# Patient Record
Sex: Male | Born: 1945 | Marital: Married | Smoking: Never smoker
Health system: Southern US, Community
[De-identification: ages and names within clinical notes are randomized; demographics above are authoritative.]

## PROBLEM LIST (undated history)

## (undated) DIAGNOSIS — E785 Hyperlipidemia, unspecified: Secondary | ICD-10-CM

## (undated) DIAGNOSIS — R251 Tremor, unspecified: Secondary | ICD-10-CM

## (undated) HISTORY — DX: Hyperlipidemia, unspecified: E78.5

## (undated) HISTORY — PX: BACK SURGERY: SHX140

## (undated) HISTORY — PX: ADENOIDECTOMY: SUR15

## (undated) HISTORY — DX: Tremor, unspecified: R25.1

## (undated) HISTORY — PX: SHOULDER SURGERY: SHX246

## (undated) HISTORY — PX: TONSILLECTOMY: SHX5217

---

## 2016-09-28 ENCOUNTER — Encounter: Payer: Self-pay | Admitting: Family Medicine

## 2016-09-28 ENCOUNTER — Ambulatory Visit (INDEPENDENT_AMBULATORY_CARE_PROVIDER_SITE_OTHER): Payer: Medicare Other | Admitting: Family Medicine

## 2016-09-28 VITALS — BP 140/80 | HR 78 | Ht 69.0 in | Wt 225.0 lb

## 2016-09-28 DIAGNOSIS — E669 Obesity, unspecified: Secondary | ICD-10-CM | POA: Diagnosis not present

## 2016-09-28 DIAGNOSIS — E785 Hyperlipidemia, unspecified: Secondary | ICD-10-CM

## 2016-09-28 DIAGNOSIS — R251 Tremor, unspecified: Secondary | ICD-10-CM

## 2016-09-28 HISTORY — DX: Tremor, unspecified: R25.1

## 2016-09-28 NOTE — Progress Notes (Signed)
Shaun Prince is a 71 y.o. male here to establish care.  Clovis CaoAutumn McNeil, CMA is acting as a Neurosurgeonscribe for KeySpanErica Adaliz Dobis, DO.  History of Present Illness:   Chief Complaint  Patient presents with  . Establish Care   Acute Concerns: Recurrent left shoulder pain. Surgery approximately 6 years ago.   Chronic Issues: Hyperlipidemia: Takes Atorvastatin 5 mg 1qd.  Tremors: Takes Propanolol 80mg  1qd.   Past Medical History:  Diagnosis Date  . Hyperlipidemia   . Tremor 09/28/2016   Social History   Social History  . Marital status: Married    Spouse name: N/A  . Number of children: N/A  . Years of education: N/A   Occupational History  . Not on file.   Social History Main Topics  . Smoking status: Never Smoker  . Smokeless tobacco: Current User    Types: Snuff  . Alcohol use Yes  . Drug use: No  . Sexual activity: Not on file   Other Topics Concern  . Not on file   Social History Narrative  . No narrative on file   Past Surgical History:  Procedure Laterality Date  . BACK SURGERY N/A   . SHOULDER SURGERY Left    Family History  Problem Relation Age of Onset  . Diabetes Mother   . Heart attack Father   . Diabetes Maternal Grandmother   . Heart attack Paternal Grandfather    No Known Allergies  Current Medications:   Current Outpatient Prescriptions:  Marland Kitchen.  Multiple Vitamins-Minerals (CENTRUM ADULTS) TABS, Take by mouth., Disp: , Rfl:  .  propranolol (INDERAL) 80 MG tablet, Take 80 mg by mouth once., Disp: , Rfl:  .  rosuvastatin (CRESTOR) 5 MG tablet, Take 5 mg by mouth daily., Disp: , Rfl:    Review of Systems:   Review of Systems  Constitutional: Negative for chills, diaphoresis, fever, malaise/fatigue and weight loss.  HENT: Negative for congestion, ear discharge, ear pain, hearing loss, nosebleeds, sinus pain, sore throat and tinnitus.   Eyes: Negative for blurred vision, double vision, photophobia, pain, discharge and redness.  Respiratory: Negative for  cough, hemoptysis, sputum production, shortness of breath, wheezing and stridor.   Cardiovascular: Negative for chest pain, palpitations, orthopnea, claudication, leg swelling and PND.  Gastrointestinal: Positive for heartburn. Negative for abdominal pain, blood in stool, constipation, diarrhea, melena, nausea and vomiting.  Genitourinary: Negative for dysuria, flank pain, frequency, hematuria and urgency.  Musculoskeletal: Positive for joint pain and myalgias.  Skin: Negative for itching and rash.  Neurological: Positive for tremors. Negative for dizziness, tingling, sensory change, speech change, focal weakness, seizures, loss of consciousness, weakness and headaches.  Endo/Heme/Allergies: Negative for environmental allergies and polydipsia. Does not bruise/bleed easily.  Psychiatric/Behavioral: Negative for depression, hallucinations, memory loss, substance abuse and suicidal ideas. The patient is not nervous/anxious and does not have insomnia.    Vitals:   Vitals:   09/28/16 1105  BP: 140/80  Pulse: 78  SpO2: 98%  Weight: 225 lb (102.1 kg)  Height: 5\' 9"  (1.753 m)     Body mass index is 33.23 kg/m.  Physical Exam:   Physical Exam  Constitutional: He is oriented to person, place, and time. He appears well-developed and well-nourished. No distress.  HENT:  Head: Normocephalic and atraumatic.  Right Ear: External ear normal.  Left Ear: External ear normal.  Nose: Nose normal.  Mouth/Throat: Oropharynx is clear and moist.  Eyes: Pupils are equal, round, and reactive to light. Conjunctivae and EOM are normal.  Neck:  Normal range of motion. Neck supple.  Cardiovascular: Normal rate, regular rhythm, normal heart sounds and intact distal pulses.   Pulmonary/Chest: Effort normal and breath sounds normal.  Abdominal: Soft. Bowel sounds are normal.  Musculoskeletal: Normal range of motion.  Neurological: He is alert and oriented to person, place, and time.  Skin: Skin is warm and  dry.  Psychiatric: He has a normal mood and affect. His behavior is normal. Judgment and thought content normal.  Nursing note and vitals reviewed.  Assessment and Plan:   Marcial Pacasimothy was seen today for establish care.  Diagnoses and all orders for this visit:  Tremor Comments: Well controlled.  No signs of complications, medication side effects, or red flags.  Continue current regimen.    Hyperlipidemia, unspecified hyperlipidemia type Comments: Well controlled.  No signs of complications, medication side effects, or red flags.  Continue current regimen.    Obesity (BMI 30-39.9) Comments: The patient is asked to make an attempt to improve diet and exercise patterns to aid in medical management of this problem.    . Reviewed expectations re: course of current medical issues. . Discussed self-management of symptoms. . Outlined signs and symptoms indicating need for more acute intervention. . Patient verbalized understanding and all questions were answered. Marland Kitchen. Health Maintenance issues including appropriate healthy diet, exercise, and smoking avoidance were discussed with patient. . See orders for this visit as documented in the electronic medical record. . Patient received an After Visit Summary.  CMA served as Neurosurgeonscribe during this visit. History, Physical, and Plan performed by medical provider. The above documentation has been reviewed and is accurate and complete. Helane RimaErica Naveen Clardy, D.O.  Helane RimaErica Yacob Wilkerson, DO [place] 09/28/2016

## 2016-11-18 ENCOUNTER — Telehealth: Payer: Self-pay | Admitting: Family Medicine

## 2016-11-18 ENCOUNTER — Encounter: Payer: Self-pay | Admitting: Family Medicine

## 2016-11-18 NOTE — Telephone Encounter (Signed)
Faxed ROI to Doctor Ria Clock [phone] mc

## 2016-11-23 NOTE — Progress Notes (Deleted)
Subjective:   Shaun Prince is a 71 y.o. male who presents for an Initial Medicare Annual Wellness Visit.  Review of Systems  No ROS.  Medicare Wellness Visit. Additional risk factors are reflected in the social history.       Objective:    There were no vitals filed for this visit. There is no height or weight on file to calculate BMI.  Current Medications (verified) Outpatient Encounter Prescriptions as of 11/26/2016  Medication Sig  . Multiple Vitamins-Minerals (CENTRUM ADULTS) TABS Take by mouth.  . propranolol (INDERAL) 80 MG tablet Take 80 mg by mouth once.  . rosuvastatin (CRESTOR) 5 MG tablet Take 5 mg by mouth daily.   No facility-administered encounter medications on file as of 11/26/2016.     Allergies (verified) Patient has no known allergies.   History: Past Medical History:  Diagnosis Date  . Hyperlipidemia   . Tremor 09/28/2016   Past Surgical History:  Procedure Laterality Date  . BACK SURGERY N/A   . SHOULDER SURGERY Left    Family History  Problem Relation Age of Onset  . Diabetes Mother   . Heart attack Father   . Diabetes Maternal Grandmother   . Heart attack Paternal Grandfather    Social History   Occupational History  . Not on file.   Social History Main Topics  . Smoking status: Never Smoker  . Smokeless tobacco: Current User    Types: Snuff  . Alcohol use Yes  . Drug use: No  . Sexual activity: Not on file   Tobacco Counseling Ready to quit: Not Answered Counseling given: Not Answered   Activities of Daily Living No flowsheet data found.  Immunizations and Health Maintenance  There is no immunization history on file for this patient. Health Maintenance Due  Topic Date Due  . Hepatitis C Screening  1945/09/24  . TETANUS/TDAP  06/08/1965  . COLONOSCOPY  06/08/1996  . PNA vac Low Risk Adult (1 of 2 - PCV13) 06/09/2011  . INFLUENZA VACCINE  09/30/2016    Patient Care Team: Helane Rima, DO as PCP - General  (Family Medicine)  Indicate any recent Medical Services you may have received from other than [facility] providers in the past year (date may be approximate).    Assessment:   This is a routine wellness examination for Shaun Prince. Physical assessment deferred to PCP.  Hearing/Vision screen No exam data present  Dietary issues and exercise activities discussed:    Goals    None     Depression Screen PHQ 2/9 Scores 09/28/2016  PHQ - 2 Score 0    Fall Risk Fall Risk  09/28/2016  Falls in the past year? No    Cognitive Function:        Screening Tests Health Maintenance  Topic Date Due  . Hepatitis C Screening  1946/02/11  . TETANUS/TDAP  06/08/1965  . COLONOSCOPY  06/08/1996  . PNA vac Low Risk Adult (1 of 2 - PCV13) 06/09/2011  . INFLUENZA VACCINE  09/30/2016        Plan:   Follow up with PCP as directed.  I have personally reviewed and noted the following in the patient's chart:   . Medical and social history . Use of alcohol, tobacco or illicit drugs  . Current medications and supplements . Functional ability and status . Nutritional status . Physical activity . Advanced directives . List of other physicians . Vitals . Screenings to include cognitive, depression, and falls . Referrals and appointments  In  addition, I have reviewed and discussed with patient certain preventive protocols, quality metrics, and best practice recommendations. A written personalized care plan for preventive services as well as general preventive health recommendations were provided to patient.     Richelle Ito, RN   11/23/2016

## 2016-11-23 NOTE — Progress Notes (Deleted)
PCP notes:   Health maintenance: Hepatitis C screening: Tdap: Colonoscopy: PCV 13: Flu:  Abnormal screenings:    Patient concerns:    Nurse concerns:   Next PCP appt:

## 2016-11-23 NOTE — Progress Notes (Deleted)
Pre visit review using our clinic review tool, if applicable. No additional management support is needed unless otherwise documented below in the visit note. 

## 2016-11-26 ENCOUNTER — Ambulatory Visit: Payer: Medicare Other | Admitting: *Deleted

## 2017-03-11 ENCOUNTER — Ambulatory Visit (INDEPENDENT_AMBULATORY_CARE_PROVIDER_SITE_OTHER): Payer: Medicare Other | Admitting: Family Medicine

## 2017-03-11 ENCOUNTER — Encounter: Payer: Self-pay | Admitting: Family Medicine

## 2017-03-11 VITALS — BP 134/78 | HR 62 | Temp 97.8°F | Ht 69.0 in | Wt 237.0 lb

## 2017-03-11 DIAGNOSIS — L578 Other skin changes due to chronic exposure to nonionizing radiation: Secondary | ICD-10-CM

## 2017-03-11 DIAGNOSIS — L989 Disorder of the skin and subcutaneous tissue, unspecified: Secondary | ICD-10-CM

## 2017-03-11 DIAGNOSIS — L570 Actinic keratosis: Secondary | ICD-10-CM

## 2017-03-11 DIAGNOSIS — L859 Epidermal thickening, unspecified: Secondary | ICD-10-CM | POA: Diagnosis not present

## 2017-03-11 NOTE — Patient Instructions (Signed)
We will have the biopsy back in a few days.  Please avoid UV radiation and sun exposure.   Skin Biopsy, Care After Refer to this sheet in the next few weeks. These instructions provide you with information about caring for yourself after your procedure. Your health care provider may also give you more specific instructions. Your treatment has been planned according to current medical practices, but problems sometimes occur. Call your health care provider if you have any problems or questions after your procedure. What can I expect after the procedure? After the procedure, it is common to have:  Soreness.  Bruising.  Itching.  Follow these instructions at home:  Rest and then return to your normal activities as told by your health care provider.  Take over-the-counter and prescription medicines only as told by your health care provider.  Follow instructions from your health care provider about how to take care of your biopsy site.Make sure you: ? Wash your hands with soap and water before you change your bandage (dressing). If soap and water are not available, use hand sanitizer. ? Change your dressing as told by your health care provider. ? Leave stitches (sutures), skin glue, or adhesive strips in place. These skin closures may need to stay in place for 2 weeks or longer. If adhesive strip edges start to loosen and curl up, you may trim the loose edges. Do not remove adhesive strips completely unless your health care provider tells you to do that. If the biopsy area bleeds, apply gentle pressure for 10 minutes.  Check your biopsy site every day for signs of infection. Check for: ? More redness, swelling, or pain. ? More fluid or blood. ? Warmth. ? Pus or a bad smell.  Keep all follow-up visits as told by your health care provider. This is important. Contact a health care provider if:  You have more redness, swelling, or pain around your biopsy site.  You have more fluid or  blood coming from your biopsy site.  Your biopsy site feels warm to the touch.  You have pus or a bad smell coming from your biopsy site.  You have a fever. Get help right away if:  You have bleeding that does not stop with pressure or a dressing. This information is not intended to replace advice given to you by your health care provider. Make sure you discuss any questions you have with your health care provider. Document Released: 03/15/2015 Document Revised: 10/13/2015 Document Reviewed: 05/16/2014 Elsevier Interactive Patient Education  [employer]2018 Elsevier Inc.

## 2017-03-11 NOTE — Progress Notes (Signed)
   Subjective:  Shaun Prince is a 72 y.o. male who presents today with a chief complaint of skin lesions.   HPI:  Skin Lesion, New Problem He has noticed 2 lesions on the anterior superior aspect of the scalp that started a year or 2 ago.  The most posterior of these lesions has become more irritated over the last few weeks.  No fevers or chills.  He has had to have cryotherapy in the past.  No drainage from the area.  No obvious precipitating events.  He has also noticed one lesion behind his right ear started about a year ago.  He is currently size since then.  No pruritus.  No drainage.  No bleeding.  No home treatments tried.  ROS: Per HPI  Objective:  Physical Exam: BP 134/78 (BP Location: Left Arm, Patient Position: Sitting, Cuff Size: Normal)   Pulse 62   Temp 97.8 F (36.6 C) (Oral)   Ht 5\' 9"  (1.753 m)   Wt 237 lb (107.5 kg)   SpO2 96%   BMI 35.00 kg/m   Skin: -Scalp: Several AK's noted on posterior scalp 1 with surrounding irritation. -Approximately 2-3 mm pink, papular lesion on right mastoid process.  Cryotherapy Procedure Note  Pre-operative Diagnosis: Actinic keratosis  Post-operative Diagnosis: Actinic keratosis  Locations: Posterior scalp  Procedure Details   Patient informed of risks (permanent scarring, infection, light or dark discoloration, bleeding, infection, weakness, numbness and recurrence of the lesion) and benefits of the procedure and verbal informed consent obtained.  The two AKs noted above were are treated with liquid nitrogen therapy, frozen until ice ball extended 2 mm beyond lesion, allowed to thaw, and treated again. The patient tolerated procedure well.  The patient was instructed on post-op care, warned that there may be blister formation, redness and pain. Recommend OTC analgesia as needed for pain.  Condition: Stable  Complications: none.  Punch Biopsy Procedure Note  Pre-operative Diagnosis: Suspicious  lesion  Post-operative Diagnosis: normal, same  Locations: Right Scalp  Indications: Diagnostic  Anesthesia: Lidocaine 1% with epinephrine without added sodium bicarbonate  Procedure Details  Patient informed of the risks (including bleeding and infection) and benefits of the  procedure and Written informed consent obtained.  The lesion and surrounding area was given a sterile prep using betadyne and draped in the usual sterile fashion. The skin was then stretched perpendicular to the skin tension lines and the lesion removed using the 4mm punch. Antibiotic ointment and a sterile dressing applied. The specimen was sent for pathologic examination. The patient tolerated the procedure well.  EBL: 2 ml  Condition: Stable  Complications: none.  Assessment/Plan:  Actinic keratosis/sun damaged skin Lesions on anterior/posterior scalp consistent with a case.  Cryotherapy applied today.  Please see above procedure note.  Advised patient to avoid UV radiation as much as possible.  Discussed warning signs and reasons to return to care.  Skin lesion Differential includes atypical nevus, atypical SK, and malignancy. Punch biopsy performed today on skin lesion on right mastoid process.  Please see above procedure note.  Pathology sent.  Advised ibuprofen as needed for pain control.  As above, advised patient to avoid UV radiation as much as possible.  Discussed warning signs and reasons to return to care.  Katina Degreealeb M. Jimmey RalphParker, MD 03/11/2017 11:21 AM

## 2017-03-12 NOTE — Progress Notes (Signed)
Pathology report was benign.  Please inform patient.  Do not need to make any other changes or do any other biopsies at this time.

## 2017-03-15 NOTE — Progress Notes (Signed)
I have printed the appointment and have [facility] to ensure the correct protocol was followed.

## 2017-04-16 NOTE — Progress Notes (Signed)
[facility]   Patient Details  Name: Shaun Prince MRN: [id number] Date of Birth: 02/06/1946 Age: 72 y.o. PCP: Helane RimaWallace, Erica, DO  Vitals:   04/16/17 1348  BP: (!) 150/94  Pulse: 68  Resp: 18  SpO2: 95%  Weight: 234 lb (106.1 kg)     [facility] Eval - 04/16/17 1300      Referral    Referring Provider  Dr. Earlene PlaterWallace    Reason for referral  Obesitity/Overweight      Measurement   Neck measurement  16.5 Inches    Waist Circumference  44 inches    Body fat  35.6 percent      Mobility and Daily Activities   I find it easy to walk up or down two or more flights of stairs.  3    I have no trouble taking out the trash.  4    I do housework such as vacuuming and dusting on my own without difficulty.  4    I can easily lift a gallon of milk (8lbs).  4    I can easily walk a mile.  4    I have no trouble reaching into high cupboards or reaching down to pick up something from the floor.  4    I do not have trouble doing out-door work such as Loss adjuster, charteredmoving the lawn, raking leaves, or gardening.  4      Mobility and Daily Activities   I feel younger than my age.  3    I feel independent.  4    I feel energetic.  2    I live an active life.   1    I feel strong.  3    I feel healthy.  4    I feel active as other people my age.  4      How fit and strong are you.   Fit and Strong Total Score  48      Past Medical History:  Diagnosis Date  . Hyperlipidemia   . Tremor 09/28/2016   Past Surgical History:  Procedure Laterality Date  . BACK SURGERY N/A   . SHOULDER SURGERY Left    Social History   Tobacco Use  Smoking Status Never Smoker  Smokeless Tobacco Current User  . Types: Snuff     Shaun Prince has signed up for the PREP and will be doing the Wed/Friday 11-noon group sessions along with his wife.     Shaun Prince 04/16/2017, 1:53 PM

## 2017-04-28 NOTE — Progress Notes (Signed)
[facility] Weekly [facility]   Patient Details  Name: Shaun Cheshireimothy Damaso MRN: [id number] Date of Birth: 11/12/1945 Age: 72 y.o. PCP: Helane RimaWallace, Erica, DO  Vitals:   04/28/17 1313  Weight: 237 lb (107.5 kg)    [facility] Weekly seesion - 04/28/17 1300      Weekly Session   Topic Discussed  Health habits    Minutes exercised this week  45 minutes cardio   cardio   Classes attended to date  1      Fun things you did this week"Visited brother/friends in [place]" Things you are grateful for"Family" Celebrations:"No TV"    Rose FillersDebbie Mohogany Toppins 04/28/2017, 1:13 PM

## 2017-05-07 NOTE — Progress Notes (Signed)
[facility] Weekly Session   Patient Details  Name: Shaun Prince MRN: [id number] Date of Birth: 10/16/1945 Age: 72 y.o. PCP: Helane RimaWallace, Erica, DO  Vitals:   05/03/17 1340  Weight: 237 lb (107.5 kg)    [facility] Weekly sees[facility] - 05/03/17 1341      Weekly Session   Topic Discussed  Restaurant Eating    Minutes exercised this week  55 minutes 40cardio/15strength   40cardio/15strength   Classes attended to date  2        Rose FillersDebbie Curry Dulski 05/07/2017, 1:42 PM

## 2017-08-17 ENCOUNTER — Telehealth: Payer: Self-pay | Admitting: Family Medicine

## 2017-08-17 NOTE — Telephone Encounter (Unsigned)
Copied from CRM [phone]. Topic: General - Other >> Aug 17, 2017 10:18 AM Raquel SarnaHayes, Teresa G wrote: Pt is needing to have his AWV scheduled. Colorectal Screening needs to be done for pt.

## 2017-08-17 NOTE — Telephone Encounter (Signed)
See note, Hailey can call the patient for the AWV to schedule 1 year and 1 day after the previous.

## 2017-08-19 NOTE — Telephone Encounter (Signed)
Called pt and left vm for pt to make AWV

## 2017-09-15 NOTE — Progress Notes (Signed)
Subjective:   Abad Manard is a 72 y.o. male who presents for Medicare Annual/Subsequent preventive examination.  Reports health as fair Retired at 9  Moved here form [place]  Son and wife came here with grand children  [place]rester in [place] - had friends there  Voices agitation that he is not happy here   Diet Would like to lose weight  Wants to exercise more  Not motivated at this time    Health Maintenance Due  Topic Date Due  . Hepatitis C Screening  [birth year]    Discussed preventive health Call placed to Dr. Ria Clock DO in [place] [phone] and given the following information after Mr. Port confirmed HIPPA  Last AWV 07/09/2016 Pneumonia PSV 23 10/23/2011 PCV 13 01/13/2016 Tdap 01/14/2016 Colonoscopy Mar 15, 2013; 10 year repeat in 2025 Requested office send report       Objective:    Vitals: BP (!) 148/90   Pulse 61   Ht 5\' 10"  (1.778 m)   Wt 240 lb (108.9 kg)   SpO2 96%   BMI 34.44 kg/m   Body mass index is 34.44 kg/m.  Advanced Directives 09/16/2017  Does Patient Have a Medical Advance Directive? No    Tobacco Social History   Tobacco Use  Smoking Status Never Smoker  Smokeless Tobacco Current User  . Types: Snuff     Ready to quit: Yes Counseling given: Yes   Clinical Intake:     Past Medical History:  Diagnosis Date  . Hyperlipidemia   . Tremor 09/28/2016   Past Surgical History:  Procedure Laterality Date  . BACK SURGERY N/A   . SHOULDER SURGERY Left    Family History  Problem Relation Age of Onset  . Diabetes Mother   . Heart attack Father   . Diabetes Maternal Grandmother   . Heart attack Paternal Grandfather    Social History   Socioeconomic History  . Marital status: Married    Spouse name: Not on file  . Number of children: Not on file  . Years of education: Not on file  . Highest education level: Not on file  Occupational History  . Not on file  Social Needs  . Financial resource strain: Not on file    . Food insecurity:    Worry: Not on file    Inability: Not on file  . Transportation needs:    Medical: Not on file    Non-medical: Not on file  Tobacco Use  . Smoking status: Never Smoker  . Smokeless tobacco: Current User    Types: Snuff  Substance and Sexual Activity  . Alcohol use: Yes  . Drug use: No  . Sexual activity: Not on file  Lifestyle  . Physical activity:    Days per week: Not on file    Minutes per session: Not on file  . Stress: Not on file  Relationships  . Social connections:    Talks on phone: Not on file    Gets together: Not on file    Attends religious service: Not on file    Active member of club or organization: Not on file    Attends meetings of clubs or organizations: Not on file    Relationship status: Not on file  Other Topics Concern  . Not on file  Social History Narrative  . Not on file    Outpatient Encounter Medications as of 09/16/2017  Medication Sig  . Multiple Vitamins-Minerals (CENTRUM ADULTS) TABS Take by mouth.  Marland Kitchen  propranolol (INDERAL) 80 MG tablet Take 80 mg by mouth once.  . rosuvastatin (CRESTOR) 5 MG tablet Take 5 mg by mouth daily.   No facility-administered encounter medications on file as of 09/16/2017.     Activities of Daily Living In your present state of health, do you have any difficulty performing the following activities: 09/16/2017  Hearing? N  Vision? N  Difficulty concentrating or making decisions? N  Walking or climbing stairs? N  Dressing or bathing? N  Doing errands, shopping? N  Preparing Food and eating ? N  Using the Toilet? N  In the past six months, have you accidently leaked urine? N  Do you have problems with loss of bowel control? N  Managing your Medications? N  Managing your Finances? N  Housekeeping or managing your Housekeeping? N  Some recent data might be hidden    Patient Care Team: Helane RimaWallace, Erica, DO as PCP - General (Family Medicine)   Assessment:   This is a routine wellness  examination for [place].  Exercise Activities and Dietary recommendations Current Exercise Habits: Home exercise routine, Intensity: Mild  Goals    . Patient Stated     Lose some weight Will try to get  More active You will investigate hiking groups in the area and start hiking        Fall Risk Fall Risk  09/16/2017 09/28/2016  Falls in the past year? No No     Depression Screen PHQ 2/9 Scores 09/28/2016  PHQ - 2 Score 0   States he needs to lose weight, needs to get active States he does not desire or the  motivation to do so [place] the [place] and [place] there Adjustment issues past retirement as Scientist, research (medical)orrester   Declines counseling  States [place]  Hopes this trip will help him Seemed more hopeful when he left    Cognitive Function MMSE - Mini Mental State Exam 09/16/2017  Not completed: (No Data)   Cognition was difficult to assist due to his agitation etc. His mood seems low and could be depressed but verbally denies  Depression. States he doesn't like it here.        Immunization History  Administered Date(s) Administered  . Influenza-Unspecified 01/06/2017  . Pneumococcal Conjugate-13 01/13/2016  . Pneumococcal Polysaccharide-23 10/23/2011  . Tdap 01/14/2016    Screening Tests Health Maintenance  Topic Date Due  . Hepatitis C Screening  1946/02/25  . INFLUENZA VACCINE  09/30/2017  . COLONOSCOPY  03/16/2023  . TETANUS/TDAP  01/13/2026  . PNA vac Low Risk Adult  Completed         Plan:     PCP Notes   Health Maintenance Discussed preventive health Call placed to Dr. Ria ClockAllison Snyder DO in [place] [phone]) and given the following information after Mr. Ladona Ridgelaylor confirmed HIPPA     Last AWV 07/09/2016 Pneumonia PSV 23 10/23/2011 PCV 13 01/13/2016 Tdap 01/14/2016 Colonoscopy Mar 15, 2013; 10 year repeat in 2025 Requested [place] office send report and will fax medical  release when Mr. Ladona Ridgelaylor leaves  today  Fax to [phone] This was completed    Abnormal Screens  BP elevated - given information on the dash diet Taking inderal; for tremors   Discussed statin and he is not taking this but will wait on labs prior to restarting  Referrals  Declined referral to behavioral health for assessment and treatment Affect confrontational at  times; improved  in the assessment Feels he will feel better when his friend comes down from [place] soon to travel to  [place].  Patient concerns; Trying to adjust to new town  Nurse Concerns; As noted  Next PCP apt TBS  The patient is somewhat resistant to care Wife scheduled his apt today       I have personally reviewed and noted the following in the patient's chart:   . Medical and social history . Use of alcohol, tobacco or illicit drugs  . Current medications and supplements . Functional ability and status . Nutritional status . Physical activity . Advanced directives . List of other physicians . Hospitalizations, surgeries, and ER visits in previous 12 months . Vitals . Screenings to include cognitive, depression, and falls . Referrals and appointments  In addition, I have reviewed and discussed with patient certain preventive protocols, quality metrics, and best practice recommendations. A written personalized care plan for preventive services as well as general preventive health recommendations were provided to patient.     Montine Circle, RN  09/16/2017

## 2017-09-16 ENCOUNTER — Ambulatory Visit (INDEPENDENT_AMBULATORY_CARE_PROVIDER_SITE_OTHER): Payer: Medicare Other

## 2017-09-16 VITALS — BP 148/90 | HR 61 | Ht 70.0 in | Wt 240.0 lb

## 2017-09-16 DIAGNOSIS — Z0000 Encounter for general adult medical examination without abnormal findings: Secondary | ICD-10-CM | POA: Diagnosis not present

## 2017-09-16 DIAGNOSIS — Z1159 Encounter for screening for other viral diseases: Secondary | ICD-10-CM | POA: Diagnosis not present

## 2017-09-16 NOTE — Patient Instructions (Addendum)
Shaun Prince , Thank you for taking time to come for your Medicare Wellness Visit. I appreciate your ongoing commitment to your health goals. Please review the following plan we discussed and let me know if I can assist you in the future.   Medicare now request all "baby boomers" test for possible exposure to Hepatitis C. Many may have been exposed due to dental work, tatoo's, vaccinations when young. The Hepatitis C virus is dormant for many years and then sometimes will cause liver cancer. If you gave blood in the past 15 years, you were most likely checked for Hep C. If you rec'd blood; you may want to consider testing or if you are high risk for any other reason.   Would recommend getting vision checks q 2 years   The Centers for Disease Control are now recommending 2 pneumonia vaccinations after 65. The first is the Prevnar 13. This helps to boost your immunity to community acquired pneumonia as well as some protection from bacterial pneumonia  The 2nd is the pneumovax 23, which offers more broad protection!  Please consider taking these as this is your best protection against pneumonia. I have entered your pneumonia injections today    Had your tdap and this has been entered in your record  Colonoscopy is due 2025;    These are the goals we discussed: Goals    . Patient Stated     Lose some weight Will try to get  More active You will investigate hiking groups in the area and start hiking        This is a list of the screening recommended for you and due dates:  Health Maintenance  Topic Date Due  .  Hepatitis C: One time screening is recommended by Center for Disease Control  (CDC) for  adults born from [birth year] through 1965.   1945-10-26  . Pneumonia vaccines (2 of 2 - PPSV23) 01/12/2017  . Flu Shot  09/30/2017  . Colon Cancer Screening  03/16/2023  . Tetanus Vaccine  01/13/2026    DASH Eating Plan DASH stands for "Dietary Approaches to Stop Hypertension." The DASH  eating plan is a healthy eating plan that has been shown to reduce high blood pressure (hypertension). It may also reduce your risk for type 2 diabetes, heart disease, and stroke. The DASH eating plan may also help with weight loss. What are tips for following this plan? General guidelines  Avoid eating more than 2,300 mg (milligrams) of salt (sodium) a day. If you have hypertension, you may need to reduce your sodium intake to 1,500 mg a day.  Limit alcohol intake to no more than 1 drink a day for nonpregnant women and 2 drinks a day for men. One drink equals 12 oz of beer, 5 oz of wine, or 1 oz of hard liquor.  Work with your health care provider to maintain a healthy body weight or to lose weight. Ask what an ideal weight is for you.  Get at least 30 minutes of exercise that causes your heart to beat faster (aerobic exercise) most days of the week. Activities may include walking, swimming, or biking.  Work with your health care provider or diet and nutrition specialist (dietitian) to adjust your eating plan to your individual calorie needs. Reading food labels  Check food labels for the amount of sodium per serving. Choose foods with less than 5 percent of the Daily Value of sodium. Generally, foods with less than 300 mg of sodium per serving  fit into this eating plan.  To find whole grains, look for the word "whole" as the first word in the ingredient list. Shopping  Buy products labeled as "low-sodium" or "no salt added."  Buy fresh foods. Avoid canned foods and premade or frozen meals. Cooking  Avoid adding salt when cooking. Use salt-free seasonings or herbs instead of table salt or sea salt. Check with your health care provider or pharmacist before using salt substitutes.  Do not fry foods. Cook foods using healthy methods such as baking, boiling, grilling, and broiling instead.  Cook with heart-healthy oils, such as olive, canola, soybean, or sunflower oil. Meal  planning   Eat a balanced diet that includes: ? 5 or more servings of fruits and vegetables each day. At each meal, try to fill half of your plate with fruits and vegetables. ? Up to 6-8 servings of whole grains each day. ? Less than 6 oz of lean meat, poultry, or fish each day. A 3-oz serving of meat is about the same size as a deck of cards. One egg equals 1 oz. ? 2 servings of low-fat dairy each day. ? A serving of nuts, seeds, or beans 5 times each week. ? Heart-healthy fats. Healthy fats called Omega-3 fatty acids are found in foods such as flaxseeds and coldwater fish, like sardines, salmon, and mackerel.  Limit how much you eat of the following: ? Canned or prepackaged foods. ? Food that is high in trans fat, such as fried foods. ? Food that is high in saturated fat, such as fatty meat. ? Sweets, desserts, sugary drinks, and other foods with added sugar. ? Full-fat dairy products.  Do not salt foods before eating.  Try to eat at least 2 vegetarian meals each week.  Eat more home-cooked food and less restaurant, buffet, and fast food.  When eating at a restaurant, ask that your food be prepared with less salt or no salt, if possible. What foods are recommended? The items listed may not be a complete list. Talk with your dietitian about what dietary choices are best for you. Grains Whole-grain or whole-wheat bread. Whole-grain or whole-wheat pasta. Brown rice. Orpah Cobb. Bulgur. Whole-grain and low-sodium cereals. Pita bread. Low-fat, low-sodium crackers. Whole-wheat flour tortillas. Vegetables Fresh or frozen vegetables (raw, steamed, roasted, or grilled). Low-sodium or reduced-sodium tomato and vegetable juice. Low-sodium or reduced-sodium tomato sauce and tomato paste. Low-sodium or reduced-sodium canned vegetables. Fruits All fresh, dried, or frozen fruit. Canned fruit in natural juice (without added sugar). Meat and other protein foods Skinless chicken or [place].  Ground chicken or [place]. Pork with fat trimmed off. Fish and seafood. Egg whites. Dried beans, peas, or lentils. Unsalted nuts, nut butters, and seeds. Unsalted canned beans. Lean cuts of beef with fat trimmed off. Low-sodium, lean deli meat. Dairy Low-fat (1%) or fat-free (skim) milk. Fat-free, low-fat, or reduced-fat cheeses. Nonfat, low-sodium ricotta or cottage cheese. Low-fat or nonfat yogurt. Low-fat, low-sodium cheese. Fats and oils Soft margarine without trans fats. Vegetable oil. Low-fat, reduced-fat, or light mayonnaise and salad dressings (reduced-sodium). Canola, safflower, olive, soybean, and sunflower oils. Avocado. Seasoning and other foods Herbs. Spices. Seasoning mixes without salt. Unsalted popcorn and pretzels. Fat-free sweets. What foods are not recommended? The items listed may not be a complete list. Talk with your dietitian about what dietary choices are best for you. Grains Baked goods made with fat, such as croissants, muffins, or some breads. Dry pasta or rice meal packs. Vegetables Creamed or fried vegetables. Vegetables in  a cheese sauce. Regular canned vegetables (not low-sodium or reduced-sodium). Regular canned tomato sauce and paste (not low-sodium or reduced-sodium). Regular tomato and vegetable juice (not low-sodium or reduced-sodium). Rosita Fire. Olives. Fruits Canned fruit in a light or heavy syrup. Fried fruit. Fruit in cream or butter sauce. Meat and other protein foods Fatty cuts of meat. Ribs. Fried meat. Tomasa Blase. Sausage. Bologna and other processed lunch meats. Salami. Fatback. Hotdogs. Bratwurst. Salted nuts and seeds. Canned beans with added salt. Canned or smoked fish. Whole eggs or egg yolks. Chicken or [place] with skin. Dairy Whole or 2% milk, cream, and half-and-half. Whole or full-fat cream cheese. Whole-fat or sweetened yogurt. Full-fat cheese. Nondairy creamers. Whipped toppings. Processed cheese and cheese spreads. Fats and oils Butter. Stick  margarine. Lard. Shortening. Ghee. Bacon fat. Tropical oils, such as coconut, palm kernel, or palm oil. Seasoning and other foods Salted popcorn and pretzels. Onion salt, garlic salt, seasoned salt, table salt, and sea salt. Worcestershire sauce. Tartar sauce. Barbecue sauce. Teriyaki sauce. Soy sauce, including reduced-sodium. Steak sauce. Canned and packaged gravies. Fish sauce. Oyster sauce. Cocktail sauce. Horseradish that you find on the shelf. Ketchup. Mustard. Meat flavorings and tenderizers. Bouillon cubes. Hot sauce and Tabasco sauce. Premade or packaged marinades. Premade or packaged taco seasonings. Relishes. Regular salad dressings. Where to find more information:  National Heart, Lung, and Blood Institute: PopSteam.is  American Heart Association: www.heart.org Summary  The DASH eating plan is a healthy eating plan that has been shown to reduce high blood pressure (hypertension). It may also reduce your risk for type 2 diabetes, heart disease, and stroke.  With the DASH eating plan, you should limit salt (sodium) intake to 2,300 mg a day. If you have hypertension, you may need to reduce your sodium intake to 1,500 mg a day.  When on the DASH eating plan, aim to eat more fresh fruits and vegetables, whole grains, lean proteins, low-fat dairy, and heart-healthy fats.  Work with your health care provider or diet and nutrition specialist (dietitian) to adjust your eating plan to your individual calorie needs. This information is not intended to replace advice given to you by your health care provider. Make sure you discuss any questions you have with your health care provider. Document Released: 02/05/2011 Document Revised: 02/10/2016 Document Reviewed: 02/10/2016 Elsevier Interactive Patient Education  2018 [employer].    Fall Prevention in the Home Falls can cause injuries. They can happen to people of all ages. There are many things you can do to make your home safe and to  help prevent falls. What can I do on the outside of my home?  Regularly fix the edges of walkways and driveways and fix any cracks.  Remove anything that might make you trip as you walk through a door, such as a raised step or threshold.  Trim any bushes or trees on the path to your home.  Use bright outdoor lighting.  Clear any walking paths of anything that might make someone trip, such as rocks or tools.  Regularly check to see if handrails are loose or broken. Make sure that both sides of any steps have handrails.  Any raised decks and porches should have guardrails on the edges.  Have any leaves, snow, or ice cleared regularly.  Use sand or salt on walking paths during winter.  Clean up any spills in your garage right away. This includes oil or grease spills. What can I do in the bathroom?  Use night lights.  Install grab bars  by the toilet and in the tub and shower. Do not use towel bars as grab bars.  Use non-skid mats or decals in the tub or shower.  If you need to sit down in the shower, use a plastic, non-slip stool.  Keep the floor dry. Clean up any water that spills on the floor as soon as it happens.  Remove soap buildup in the tub or shower regularly.  Attach bath mats securely with double-sided non-slip rug tape.  Do not have throw rugs and other things on the floor that can make you trip. What can I do in the bedroom?  Use night lights.  Make sure that you have a light by your bed that is easy to reach.  Do not use any sheets or blankets that are too big for your bed. They should not hang down onto the floor.  Have a firm chair that has side arms. You can use this for support while you get dressed.  Do not have throw rugs and other things on the floor that can make you trip. What can I do in the kitchen?  Clean up any spills right away.  Avoid walking on wet floors.  Keep items that you use a lot in easy-to-reach places.  If you need to reach  something above you, use a strong step stool that has a grab bar.  Keep electrical cords out of the way.  Do not use floor polish or wax that makes floors slippery. If you must use wax, use non-skid floor wax.  Do not have throw rugs and other things on the floor that can make you trip. What can I do with my stairs?  Do not leave any items on the stairs.  Make sure that there are handrails on both sides of the stairs and use them. Fix handrails that are broken or loose. Make sure that handrails are as long as the stairways.  Check any carpeting to make sure that it is firmly attached to the stairs. Fix any carpet that is loose or worn.  Avoid having throw rugs at the top or bottom of the stairs. If you do have throw rugs, attach them to the floor with carpet tape.  Make sure that you have a light switch at the top of the stairs and the bottom of the stairs. If you do not have them, ask someone to add them for you. What else can I do to help prevent falls?  Wear shoes that: ? Do not have high heels. ? Have rubber bottoms. ? Are comfortable and fit you well. ? Are closed at the toe. Do not wear sandals.  If you use a stepladder: ? Make sure that it is fully opened. Do not climb a closed stepladder. ? Make sure that both sides of the stepladder are locked into place. ? Ask someone to hold it for you, if possible.  Clearly mark and make sure that you can see: ? Any grab bars or handrails. ? First and last steps. ? Where the edge of each step is.  Use tools that help you move around (mobility aids) if they are needed. These include: ? Canes. ? Walkers. ? Scooters. ? Crutches.  Turn on the lights when you go into a dark area. Replace any light bulbs as soon as they burn out.  Set up your furniture so you have a clear path. Avoid moving your furniture around.  If any of your floors are uneven, fix them.  If there are any pets around you, be aware of where they are.  Review  your medicines with your doctor. Some medicines can make you feel dizzy. This can increase your chance of falling. Ask your doctor what other things that you can do to help prevent falls. This information is not intended to replace advice given to you by your health care provider. Make sure you discuss any questions you have with your health care provider. Document Released: 12/13/2008 Document Revised: 07/25/2015 Document Reviewed: 03/23/2014 Elsevier Interactive Patient Education  2018 [employer].   Health Maintenance, Male A healthy lifestyle and preventive care is important for your health and wellness. Ask your health care provider about what schedule of regular examinations is right for you. What should I know about weight and diet? Eat a Healthy Diet  Eat plenty of vegetables, fruits, whole grains, low-fat dairy products, and lean protein.  Do not eat a lot of foods high in solid fats, added sugars, or salt.  Maintain a Healthy Weight Regular exercise can help you achieve or maintain a healthy weight. You should:  Do at least 150 minutes of exercise each week. The exercise should increase your heart rate and make you sweat (moderate-intensity exercise).  Do strength-training exercises at least twice a week.  Watch Your Levels of Cholesterol and Blood Lipids  Have your blood tested for lipids and cholesterol every 5 years starting at 71 years of age. If you are at high risk for heart disease, you should start having your blood tested when you are 71 years old. You may need to have your cholesterol levels checked more often if: ? Your lipid or cholesterol levels are high. ? You are older than 71 years of age. ? You are at high risk for heart disease.  What should I know about cancer screening? Many types of cancers can be detected early and may often be prevented. Lung Cancer  You should be screened every year for lung cancer if: ? You are a current smoker who has smoked  for at least 30 years. ? You are a former smoker who has quit within the past 15 years.  Talk to your health care provider about your screening options, when you should start screening, and how often you should be screened.  Colorectal Cancer  Routine colorectal cancer screening usually begins at 71 years of age and should be repeated every 5-10 years until you are 71 years old. You may need to be screened more often if early forms of precancerous polyps or small growths are found. Your health care provider may recommend screening at an earlier age if you have risk factors for colon cancer.  Your health care provider may recommend using home test kits to check for hidden blood in the stool.  A small camera at the end of a tube can be used to examine your colon (sigmoidoscopy or colonoscopy). This checks for the earliest forms of colorectal cancer.  Prostate and Testicular Cancer  Depending on your age and overall health, your health care provider may do certain tests to screen for prostate and testicular cancer.  Talk to your health care provider about any symptoms or concerns you have about testicular or prostate cancer.  Skin Cancer  Check your skin from head to toe regularly.  Tell your health care provider about any new moles or changes in moles, especially if: ? There is a change in a mole's size, shape, or color. ? You have a mole that  is larger than a pencil eraser.  Always use sunscreen. Apply sunscreen liberally and repeat throughout the day.  Protect yourself by wearing long sleeves, pants, a wide-brimmed hat, and sunglasses when outside.  What should I know about heart disease, diabetes, and high blood pressure?  If you are 30-6 years of age, have your blood pressure checked every 3-5 years. If you are 71 years of age or older, have your blood pressure checked every year. You should have your blood pressure measured twice-once when you are at a hospital or clinic, and  once when you are not at a hospital or clinic. Record the average of the two measurements. To check your blood pressure when you are not at a hospital or clinic, you can use: ? An automated blood pressure machine at a pharmacy. ? A home blood pressure monitor.  Talk to your health care provider about your target blood pressure.  If you are between 67-61 years old, ask your health care provider if you should take aspirin to prevent heart disease.  Have regular diabetes screenings by checking your fasting blood sugar level. ? If you are at a normal weight and have a low risk for diabetes, have this test once every three years after the age of 7. ? If you are overweight and have a high risk for diabetes, consider being tested at a younger age or more often.  A one-time screening for abdominal aortic aneurysm (AAA) by ultrasound is recommended for men aged 29-75 years who are current or former smokers. What should I know about preventing infection? Hepatitis B If you have a higher risk for hepatitis B, you should be screened for this virus. Talk with your health care provider to find out if you are at risk for hepatitis B infection. Hepatitis C Blood testing is recommended for:  Everyone born from [birth year] through 1965.  Anyone with known risk factors for hepatitis C.  Sexually Transmitted Diseases (STDs)  You should be screened each year for STDs including gonorrhea and chlamydia if: ? You are sexually active and are younger than 71 years of age. ? You are older than 72 years of age and your health care provider tells you that you are at risk for this type of infection. ? Your sexual activity has changed since you were last screened and you are at an increased risk for chlamydia or gonorrhea. Ask your health care provider if you are at risk.  Talk with your health care provider about whether you are at high risk of being infected with HIV. Your health care provider may recommend a  prescription medicine to help prevent HIV infection.  What else can I do?  Schedule regular health, dental, and eye exams.  Stay current with your vaccines (immunizations).  Do not use any tobacco products, such as cigarettes, chewing tobacco, and e-cigarettes. If you need help quitting, ask your health care provider.  Limit alcohol intake to no more than 2 drinks per day. One drink equals 12 ounces of beer, 5 ounces of wine, or 1 ounces of hard liquor.  Do not use street drugs.  Do not share needles.  Ask your health care provider for help if you need support or information about quitting drugs.  Tell your health care provider if you often feel depressed.  Tell your health care provider if you have ever been abused or do not feel safe at home. This information is not intended to replace advice given to you by your health  care provider. Make sure you discuss any questions you have with your health care provider. Document Released: 08/15/2007 Document Revised: 10/16/2015 Document Reviewed: 11/20/2014 Elsevier Interactive Patient Education  [employer].

## 2017-09-17 NOTE — Progress Notes (Signed)
I have personally reviewed the Medicare Annual Wellness Visit and agree with the assessment and plan.  Katina Degreealeb M. Jimmey RalphParker, MD 09/17/2017 8:03 AM

## 2017-09-23 DIAGNOSIS — G4733 Obstructive sleep apnea (adult) (pediatric): Secondary | ICD-10-CM | POA: Insufficient documentation

## 2017-11-08 ENCOUNTER — Telehealth: Payer: Self-pay

## 2017-11-08 NOTE — Telephone Encounter (Signed)
Left message for patient or his wife to return call.

## 2017-11-08 NOTE — Telephone Encounter (Signed)
See note

## 2017-11-08 NOTE — Telephone Encounter (Signed)
Copied from CRM [id number]. Topic: General - Other >> Nov 08, 2017  9:27 AM Oneal Grout wrote: Reason for CRM: Vernell Leep is stating patient needs a colorectal screening. Wants to make sure this is correct. Please advise

## 2017-11-08 NOTE — Telephone Encounter (Signed)
Wife, Claris Che, calling and states that she checked with the patient's previous PCP from [place] and they told her that the patient had a colorectal in 2015 and it was negative and the pcp suggested colorectal screening every 10 years. Patient's wife would like to know if Dr Earlene Plater would be okay with going forward with a colorectal screening at this time?

## 2017-11-09 NOTE — Telephone Encounter (Signed)
Mailbox is full.

## 2017-11-12 NOTE — Telephone Encounter (Signed)
Spoke with patients wife to let her know that colonoscopy report from 03/2013 shows that he does not need another colonoscopy for 10 years. Patients wife verbalized understanding.

## 2018-01-14 ENCOUNTER — Telehealth: Payer: Self-pay | Admitting: Family Medicine

## 2018-01-14 NOTE — Telephone Encounter (Signed)
Patient Shaun Cheshireimothy Begley needs a refill on   propranolol (INDERAL) 80 MG tablet   Patient has three remaining and is intended to take this once daily.   Patient says he usually gets 30 day refills, but is having to make contact with Dr. Earlene PlaterWallace each time he is needing a refill and was under the impression it would be a ongoing prescription.

## 2018-01-17 NOTE — Telephone Encounter (Signed)
Ok to fill we have never given and has not been seen in over a year?

## 2018-01-18 NOTE — Telephone Encounter (Signed)
Needs visit

## 2018-01-18 NOTE — Telephone Encounter (Signed)
Spoke to pt's wife Claris CheMargaret, told her pt called for refill and needs an appt has not been seen in over a year. Dr. Earlene PlaterWallace said needs to be seen. Claris CheMargaret verbalized understanding and would like to schedule now pt only has few pills left. Appointment scheduled for 11/20 at 9:20 AM with Dr. Earlene PlaterWallace. Claris CheMargaret verbalized understanding and will let pt know.

## 2018-01-18 NOTE — Progress Notes (Signed)
Shaun Prince is a 72 y.o. male is here for follow up.  History of Present Illness:   HPI: See Assessment and Plan section for Problem Based Charting of issues discussed today.   Health Maintenance Due  Topic Date Due  . Hepatitis C Screening  [birth year]   Depression screen PHQ 2/9 09/28/2016  Decreased Interest 0  Down, Depressed, Hopeless 0  PHQ - 2 Score 0   PMHx, SurgHx, SocialHx, FamHx, Medications, and Allergies were reviewed in the Visit Navigator and updated as appropriate.   Patient Active Problem List   Diagnosis Date Noted  . Obstructive sleep apnea 09/23/2017  . Tremor 09/28/2016  . Hyperlipidemia 09/28/2016   Social History   Tobacco Use  . Smoking status: Never Smoker  . Smokeless tobacco: Current User    Types: Snuff  Substance Use Topics  . Alcohol use: Yes  . Drug use: No   Current Medications and Allergies:   Marland Kitchen  Multiple Vitamins-Minerals (CENTRUM ADULTS) TABS, Take by mouth., Disp: , Rfl:  .  propranolol (INDERAL) 80 MG tablet, Take 80 mg by mouth once., Disp: , Rfl:  .  rosuvastatin (CRESTOR) 5 MG tablet, Take 5 mg by mouth daily., Disp: , Rfl:   No Known Allergies   Review of Systems   Pertinent items are noted in the HPI. Otherwise, ROS is negative.  Vitals:   Vitals:   01/19/18 0934  BP: 134/82  Pulse: 67  Temp: 97.8 F (36.6 C)  TempSrc: Oral  SpO2: 94%  Weight: 237 lb 6.4 oz (107.7 kg)  Height: 5\' 9"  (1.753 m)     Body mass index is 35.06 kg/m.  Physical Exam:   Physical Exam  Constitutional: He appears well-developed and well-nourished. No distress.  HENT:  Head: Normocephalic and atraumatic.  Right Ear: External ear normal.  Left Ear: External ear normal.  Nose: Nose normal.  Mouth/Throat: Oropharynx is clear and moist.  Eyes: Pupils are equal, round, and reactive to light. Conjunctivae and EOM are normal.  Neck: Neck supple.  Cardiovascular: Normal rate, regular rhythm and intact distal pulses.  Pulmonary/Chest:  Effort normal.  Abdominal: Soft. Bowel sounds are normal.  Musculoskeletal: Normal range of motion.  Neurological: He is alert.  Skin: Skin is warm.  Psychiatric: He has a normal mood and affect. His behavior is normal.  Nursing note and vitals reviewed.  Assessment and Plan:   1. Hyperlipidemia, unspecified hyperlipidemia type History: Is the patient taking medications without problems? No. Does the patient complain of muscle aches?  No. Trying to exercise on a regular basis? No. Compliant with diet? No.  No results found for: CHOL, HDL, LDLCALC, LDLDIRECT, TRIG, CHOLHDL No results found for: ALT, AST, GGT, ALKPHOS, BILITOT   Assessment/Plan: Dyslipidemia under unknown control. Continue dietary measures. Continue regular exercise, an average 40 minutes of moderate to vigorous-intensity aerobic activity 3 or 4 times per week. Lipid-lowering medications: restart Crestor.  - Comprehensive metabolic panel - Lipid panel - rosuvastatin (CRESTOR) 5 MG tablet; Take 1 tablet (5 mg total) by mouth daily.  2. Obesity (BMI 30-39.9) History:  Wt Readings from Last 3 Encounters:  01/19/18 237 lb 6.4 oz (107.7 kg)  09/16/17 240 lb (108.9 kg)  05/03/17 237 lb (107.5 kg)   Assessment/Plan: The patient is asked to make an attempt to improve diet and exercise patterns to aid in medical management of this problem. Recheck at next visit.   3. Tremor - CBC with Differential/Platelet - propranolol (INDERAL) 80 MG tablet; Take  1 tablet (80 mg total) by mouth once for 1 dose.  4. 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 Reviewed expectations re: course of current medical issues. . Discussed self-management of symptoms. . Outlined signs and symptoms indicating need for more acute intervention. . Patient verbalized understanding and all questions were answered. Marland Kitchen. Health  Maintenance issues including appropriate healthy diet, exercise, and smoking avoidance were discussed with patient. . See orders for this visit as documented in the electronic medical record. . Patient received an After Visit Summary.  Helane RimaErica Liara Holm, DO [place], [facility] 01/19/2018

## 2018-01-19 ENCOUNTER — Encounter: Payer: Self-pay | Admitting: Family Medicine

## 2018-01-19 ENCOUNTER — Ambulatory Visit: Payer: Medicare Other | Admitting: Family Medicine

## 2018-01-19 VITALS — BP 134/82 | HR 67 | Temp 97.8°F | Ht 69.0 in | Wt 237.4 lb

## 2018-01-19 DIAGNOSIS — E669 Obesity, unspecified: Secondary | ICD-10-CM

## 2018-01-19 DIAGNOSIS — E785 Hyperlipidemia, unspecified: Secondary | ICD-10-CM | POA: Diagnosis not present

## 2018-01-19 DIAGNOSIS — R251 Tremor, unspecified: Secondary | ICD-10-CM | POA: Diagnosis not present

## 2018-01-19 DIAGNOSIS — Z9189 Other specified personal risk factors, not elsewhere classified: Secondary | ICD-10-CM

## 2018-01-19 DIAGNOSIS — M47816 Spondylosis without myelopathy or radiculopathy, lumbar region: Secondary | ICD-10-CM

## 2018-01-19 DIAGNOSIS — Z1159 Encounter for screening for other viral diseases: Secondary | ICD-10-CM

## 2018-01-19 LAB — COMPREHENSIVE METABOLIC PANEL
ALT: 27 U/L (ref 0–53)
AST: 16 U/L (ref 0–37)
Albumin: 4.3 g/dL (ref 3.5–5.2)
Alkaline Phosphatase: 47 U/L (ref 39–117)
BUN: 21 mg/dL (ref 6–23)
CO2: 28 mEq/L (ref 19–32)
Calcium: 9.2 mg/dL (ref 8.4–10.5)
Chloride: 106 mEq/L (ref 96–112)
Creatinine, Ser: 1.37 mg/dL (ref 0.40–1.50)
GFR: 54.35 mL/min — ABNORMAL LOW (ref >60.00)
Glucose, Bld: 106 mg/dL — ABNORMAL HIGH (ref 70–99)
Potassium: 4.6 mEq/L (ref 3.5–5.1)
Sodium: 142 mEq/L (ref 135–145)
Total Bilirubin: 0.3 mg/dL (ref 0.2–1.2)
Total Protein: 6.7 g/dL (ref 6.0–8.3)

## 2018-01-19 LAB — CBC WITH DIFFERENTIAL/PLATELET
Basophils Absolute: 0 10*3/uL (ref 0.0–0.1)
Basophils Relative: 0.4 % (ref 0.0–3.0)
Eosinophils Absolute: 0.2 10*3/uL (ref 0.0–0.7)
Eosinophils Relative: 3.9 % (ref 0.0–5.0)
HCT: 45 % (ref 39.0–52.0)
Hemoglobin: 15 g/dL (ref 13.0–17.0)
Lymphocytes Relative: 35.4 % (ref 12.0–46.0)
Lymphs Abs: 2.1 10*3/uL (ref 0.7–4.0)
MCHC: 33.4 g/dL (ref 30.0–36.0)
MCV: 92.6 fl (ref 78.0–100.0)
Monocytes Absolute: 0.7 10*3/uL (ref 0.1–1.0)
Monocytes Relative: 12.3 % — ABNORMAL HIGH (ref 3.0–12.0)
Neutro Abs: 2.8 10*3/uL (ref 1.4–7.7)
Neutrophils Relative %: 48 % (ref 43.0–77.0)
Platelets: 254 10*3/uL (ref 150.0–400.0)
RBC: 4.86 Mil/uL (ref 4.22–5.81)
RDW: 13.3 % (ref 11.5–15.5)
WBC: 5.9 10*3/uL (ref 4.0–10.5)

## 2018-01-19 LAB — LIPID PANEL
Cholesterol: 174 mg/dL (ref 0–200)
HDL: 27.2 mg/dL — ABNORMAL LOW (ref >39.00)
NonHDL: 146.34
Total CHOL/HDL Ratio: 6
Triglycerides: 277 mg/dL — ABNORMAL HIGH (ref 0.0–149.0)
VLDL: 55.4 mg/dL — ABNORMAL HIGH (ref 0.0–40.0)

## 2018-01-19 LAB — LDL CHOLESTEROL, DIRECT: Direct LDL: 116 mg/dL

## 2018-01-19 MED ORDER — PROPRANOLOL HCL 80 MG PO TABS: 80.0000 mg | ORAL_TABLET | Freq: Once | ORAL | 3 refills | Status: DC

## 2018-01-19 MED ORDER — ROSUVASTATIN CALCIUM 5 MG PO TABS: 5.0000 mg | ORAL_TABLET | Freq: Every day | ORAL | 3 refills | Status: DC

## 2018-01-20 LAB — HEPATITIS C ANTIBODY
Hepatitis C Ab: NONREACTIVE
SIGNAL TO CUT-OFF: 0.01 (ref <1.00)

## 2018-02-24 ENCOUNTER — Ambulatory Visit (INDEPENDENT_AMBULATORY_CARE_PROVIDER_SITE_OTHER): Payer: Medicare Other | Admitting: Family Medicine

## 2018-02-24 ENCOUNTER — Encounter: Payer: Self-pay | Admitting: Family Medicine

## 2018-02-24 VITALS — BP 126/78 | HR 74 | Temp 97.9°F | Ht 69.0 in | Wt 235.8 lb

## 2018-02-24 DIAGNOSIS — J40 Bronchitis, not specified as acute or chronic: Secondary | ICD-10-CM

## 2018-02-24 NOTE — Patient Instructions (Signed)
Cool mist humidifier at night Honey off spoon is great for cough Robitussin DM during day for cough and nyquil at night Finish up meds given at urgent care.   Acute Bronchitis, Adult Acute bronchitis is when air tubes (bronchi) in the lungs suddenly get swollen. The condition can make it hard to breathe. It can also cause these symptoms:  A cough.  Coughing up clear, yellow, or green mucus.  Wheezing.  Chest congestion.  Shortness of breath.  A fever.  Body aches.  Chills.  A sore throat. Follow these instructions at home:  Medicines  Take over-the-counter and prescription medicines only as told by your doctor.  If you were prescribed an antibiotic medicine, take it as told by your doctor. Do not stop taking the antibiotic even if you start to feel better. General instructions  Rest.  Drink enough fluids to keep your pee (urine) pale yellow.  Avoid smoking and secondhand smoke. If you smoke and you need help quitting, ask your doctor. Quitting will help your lungs heal faster.  Use an inhaler, cool mist vaporizer, or humidifier as told by your doctor.  Keep all follow-up visits as told by your doctor. This is important. How is this prevented? To lower your risk of getting this condition again:  Wash your hands often with soap and water. If you cannot use soap and water, use hand sanitizer.  Avoid contact with people who have cold symptoms.  Try not to touch your hands to your mouth, nose, or eyes.  Make sure to get the flu shot every year. Contact a doctor if:  Your symptoms do not get better in 2 weeks. Get help right away if:  You cough up blood.  You have chest pain.  You have very bad shortness of breath.  You become dehydrated.  You faint (pass out) or keep feeling like you are going to pass out.  You keep throwing up (vomiting).  You have a very bad headache.  Your fever or chills gets worse. This information is not intended to replace  advice given to you by your health care provider. Make sure you discuss any questions you have with your health care provider. Document Released: 08/05/2007 Document Revised: 09/30/2016 Document Reviewed: 08/07/2015 Elsevier Interactive Patient Education  2019 [employer]

## 2018-02-24 NOTE — Progress Notes (Signed)
Patient: Shaun Prince MRN: [id number] DOB: 09/26/1945 PCP: Helane RimaWallace, Erica, DO     Subjective:  Chief Complaint  Patient presents with  . Cough  Barnie MortJoEllen Thompson, CMA acting as scribe for MotorolaDr.Delwin Raczkowski.   HPI: The patient is a 72 y.o. male who presents today for productive cough that started while he was out of town. He was seen at urgent care and given antibiotics as wall as inhaler. He is feeling a lot better. He was seen on 02/21/18 at [place] in [place]. He was diagnosed with bronchitis and given an albuterol neb and ipratropium neb while in office. He was discharged with a zpack, steroids and ventolin inhaler. He is feeling good and states his wife insisted that he come over here today. He is pretty sure he got this from one of his grandkids. He has had no fever/chills, no shortness of breath or wheezing. Eating and drinking normal. He has slept good last night and today. He has used something over the counter for cough.    Review of Systems  Constitutional: Negative for chills, fatigue and fever.  HENT: Positive for congestion. Negative for ear pain and hearing loss.   Respiratory: Positive for cough.        Productive and clear   Cardiovascular: Negative for chest pain and palpitations.  Genitourinary: Negative for difficulty urinating, frequency and urgency.  Neurological: Negative for dizziness and headaches.    Allergies Patient has No Known Allergies.  Past Medical History Patient  has a past medical history of Hyperlipidemia and Tremor (09/28/2016).  Surgical History Patient  has a past surgical history that includes Shoulder surgery (Left); Back surgery (N/A); Tonsillectomy; and Adenoidectomy.  Family History Pateint's family history includes Coronary artery disease in his father; Diabetes in his maternal grandmother and mother; Heart attack in his father and paternal grandfather; Skin cancer in his father.  Social History Patient  reports that he has never  smoked. His smokeless tobacco use includes snuff. He reports current alcohol use. He reports that he does not use drugs.    Objective: Vitals:   02/24/18 1435  BP: 126/78  Pulse: 74  Temp: 97.9 F (36.6 C)  TempSrc: Oral  SpO2: 94%  Weight: 235 lb 12.8 oz (107 kg)  Height: 5\' 9"  (1.753 m)    Body mass index is 34.82 kg/m.  Physical Exam Vitals signs reviewed.  Constitutional:      Appearance: He is normal weight.  HENT:     Right Ear: Tympanic membrane and ear canal normal.     Left Ear: Tympanic membrane and ear canal normal.     Nose: Nose normal. No congestion.  Cardiovascular:     Rate and Rhythm: Normal rate and regular rhythm.     Heart sounds: Normal heart sounds.  Pulmonary:     Effort: Pulmonary effort is normal. No respiratory distress.     Breath sounds: Normal breath sounds. No wheezing or rales.  Abdominal:     General: Abdomen is flat.     Palpations: Abdomen is soft.  Skin:    Capillary Refill: Capillary refill takes less than 2 seconds.  Neurological:     Mental Status: He is alert.        Assessment/plan: 1. Bronchitis Doing much better today. Has been treated and exam normal. Discussed he can cough for up to 6 weeks with bronchitis. Conservative therapy discussed with cool mist humidifier, honey, and otc cough medication. He declines any codeine cough syrup and feels like  he is doing well. Fever/chills or worsening symptoms he is to let us know. Records reviewed from urgent care.     Return if symptoms worsen or fail to improve.     Orland MustardAllison Seibert Keeter, MD [place] [facility]  02/24/2018

## 2018-07-18 ENCOUNTER — Other Ambulatory Visit: Payer: Self-pay

## 2018-07-18 ENCOUNTER — Telehealth: Payer: Self-pay | Admitting: Family Medicine

## 2018-07-18 ENCOUNTER — Emergency Department (HOSPITAL_COMMUNITY)
Admission: EM | Admit: 2018-07-18 | Discharge: 2018-07-18 | Disposition: A | Discharge status: Home / Self Care | Payer: Medicare Other | Attending: Emergency Medicine | Admitting: Emergency Medicine

## 2018-07-18 ENCOUNTER — Encounter (HOSPITAL_COMMUNITY): Payer: Self-pay | Admitting: Emergency Medicine

## 2018-07-18 DIAGNOSIS — H1132 Conjunctival hemorrhage, left eye: Secondary | ICD-10-CM | POA: Diagnosis not present

## 2018-07-18 DIAGNOSIS — Z79899 Other long term (current) drug therapy: Secondary | ICD-10-CM | POA: Insufficient documentation

## 2018-07-18 DIAGNOSIS — F17220 Nicotine dependence, chewing tobacco, uncomplicated: Secondary | ICD-10-CM | POA: Diagnosis not present

## 2018-07-18 DIAGNOSIS — R600 Localized edema: Secondary | ICD-10-CM | POA: Diagnosis present

## 2018-07-18 MED ORDER — FLUORESCEIN SODIUM 1 MG OP STRP
1.0000 | ORAL_STRIP | Freq: Once | OPHTHALMIC | Status: DC
  Filled 2018-07-18: qty 1

## 2018-07-18 MED ORDER — TETRACAINE HCL 0.5 % OP SOLN
2.0000 [drp] | Freq: Once | OPHTHALMIC | Status: DC
  Filled 2018-07-18: qty 4

## 2018-07-18 NOTE — ED Triage Notes (Signed)
Pt c/o left eye itchiness states he was watching TV and suddenly his left eye started getting itchy he rubber it and now is red and swollen.

## 2018-07-18 NOTE — Telephone Encounter (Signed)
Patients wife called in to set up a HFU would like to be seen soon as possible only thing that is left is for Friday. Patients wife would like a call back [phone].

## 2018-07-18 NOTE — ED Provider Notes (Signed)
[facility] EMERGENCY DEPARTMENT Provider Note  CSN: [id number]  Arrival date & time: 07/18/18 0456  Chief Complaint(s) Facial Swelling  HPI Shaun Prince is a 73 y.o. male who presents to the emergency department with left eyes swelling.  He reports 1 to 2 hours prior to arrival, while watching TV, he felt his itching and rubbing it.  When he looked in the mirror, he saw his globe swollen and red.  He denies any associated pain.  No visual disturbance. No headache. No anticoagulation.  HPI  Past Medical History Past Medical History:  Diagnosis Date  . Hyperlipidemia   . Tremor 09/28/2016   Patient Active Problem List   Diagnosis Date Noted  . Obstructive sleep apnea 09/23/2017  . Tremor 09/28/2016  . Hyperlipidemia 09/28/2016   Home Medication(s) Prior to Admission medications   Medication Sig Start Date End Date Taking? Authorizing Provider  Multiple Vitamins-Minerals (CENTRUM ADULTS) TABS Take by mouth.    [provider]  propranolol (INDERAL) 80 MG tablet Take 1 tablet (80 mg total) by mouth once for 1 dose. 01/19/18 02/24/18  Helane Rima, DO  rosuvastatin (CRESTOR) 5 MG tablet Take 1 tablet (5 mg total) by mouth daily. 01/19/18   Helane Rima, DO                                                                                                                                    Past Surgical History Past Surgical History:  Procedure Laterality Date  . ADENOIDECTOMY    . BACK SURGERY N/A   . SHOULDER SURGERY Left   . TONSILLECTOMY     Family History Family History  Problem Relation Age of Onset  . Diabetes Mother   . Heart attack Father   . Coronary artery disease Father   . Skin cancer Father   . Diabetes Maternal Grandmother   . Heart attack Paternal Grandfather     Social History Social History   Tobacco Use  . Smoking status: Never Smoker  . Smokeless tobacco: Current User    Types: Snuff  Substance Use Topics  . Alcohol  use: Yes  . Drug use: No   Allergies Patient has no known allergies.  Review of Systems Review of Systems All other systems are reviewed and are negative for acute change except as noted in the HPI  Physical Exam Vital Signs  I have reviewed the triage vital signs BP (!) 162/96 (BP Location: Right Arm)   Pulse 73   Temp 98.1 F (36.7 C) (Oral)   Resp 18   Ht 5' 10.5" (1.791 m)   Wt 104.3 kg   SpO2 95%   BMI 32.54 kg/m   Physical Exam Vitals signs reviewed.  Constitutional:      General: He is not in acute distress.    Appearance: He is well-developed. He is not diaphoretic.  HENT:     Head: Normocephalic and at[facility].  Jaw: No trismus.     Right Ear: External ear normal.     Left Ear: External ear normal.     Nose: Nose normal.  Eyes:     General: Lids are normal. No visual field deficit or scleral icterus.    Conjunctiva/sclera:     Left eye: Hemorrhage present.     Comments: Right: 20/20 OP 19  Left: 20/20 20 No fluorescein uptake, evidence of abrasion or Seidel sign concerning for rupture.  Neck:     Musculoskeletal: Normal range of motion.     Trachea: Phonation normal.  Cardiovascular:     Rate and Rhythm: Normal rate and regular rhythm.  Pulmonary:     Effort: Pulmonary effort is normal. No respiratory distress.     Breath sounds: No stridor.  Abdominal:     General: There is no distension.  Musculoskeletal: Normal range of motion.  Neurological:     Mental Status: He is alert and oriented to person, place, and time.  Psychiatric:        Behavior: Behavior normal.     ED Results and Treatments Labs (all labs ordered are listed, but only abnormal results are displayed) Labs Reviewed - No data to display                                                                                                                       EKG  EKG Interpretation  Date/Time:    Ventricular Rate:    PR Interval:    QRS Duration:   QT Interval:    QTC  Calculation:   R Axis:     Text Interpretation:        Radiology No results found. Pertinent labs & imaging results that were available during my care of the patient were reviewed by me and considered in my medical decision making (see chart for details).  Medications Ordered in ED Medications - No data to display                                                                                                                                  Procedures Procedures  (including critical care time)  Medical Decision Making / ED Course I have reviewed the nursing notes for this encounter and the patient's prior records (if available in EHR or on provided paperwork).    Subconjunctival hemorrhage.  No evidence of globe rupture. Corneal abrasion.  Patient  anticoagulation.  Pressures reassuring.  Supportive management given.  Ophthalmology follow-up as needed.  The patient appears reasonably screened and/or stabilized for discharge and I doubt any other medical condition or other [facility] requiring further screening, evaluation, or treatment in the ED at this time prior to discharge.  The patient is safe for discharge with strict return precautions.   Final Clinical Impression(s) / ED Diagnoses Final diagnoses:  Subconjunctival hemorrhage of left eye     Disposition: Discharge  Condition: Good  I have discussed the results, Dx and Tx plan with the patient who expressed understanding and agree(s) with the plan. Discharge instructions discussed at great length. The patient was given strict return precautions who verbalized understanding of the instructions. No further questions at time of discharge.    ED Discharge Orders    None       Follow Up: Helane Rima, DO [address] [place] [address] [phone]  Schedule an appointment as soon as possible for a visit  As needed  Shon Millet, MD [address] [place] [address] [phone]   Schedule an appointment as soon as possible for a visit      This chart was dictated using voice recognition software.  Despite best efforts to proofread,  errors can occur which can change the documentation meaning.   Nira Conn, MD 07/19/18 [phone]

## 2018-07-19 NOTE — Telephone Encounter (Signed)
Left message to return call to our office.  

## 2018-07-28 NOTE — Telephone Encounter (Signed)
Can you call patient and see why app not made?

## 2018-08-03 ENCOUNTER — Ambulatory Visit: Payer: Medicare Other | Admitting: Family Medicine

## 2018-08-03 ENCOUNTER — Encounter: Payer: Self-pay | Admitting: Family Medicine

## 2018-08-03 VITALS — BP 140/72 | HR 85 | Temp 98.6°F | Ht 70.5 in | Wt 239.8 lb

## 2018-08-03 DIAGNOSIS — E7800 Pure hypercholesterolemia, unspecified: Secondary | ICD-10-CM

## 2018-08-03 DIAGNOSIS — G4733 Obstructive sleep apnea (adult) (pediatric): Secondary | ICD-10-CM | POA: Diagnosis not present

## 2018-08-03 DIAGNOSIS — I10 Essential (primary) hypertension: Secondary | ICD-10-CM

## 2018-08-03 DIAGNOSIS — E669 Obesity, unspecified: Secondary | ICD-10-CM | POA: Insufficient documentation

## 2018-08-03 LAB — COMPREHENSIVE METABOLIC PANEL
ALT: 23 U/L (ref 0–53)
AST: 13 U/L (ref 0–37)
Albumin: 4.2 g/dL (ref 3.5–5.2)
Alkaline Phosphatase: 46 U/L (ref 39–117)
BUN: 16 mg/dL (ref 6–23)
CO2: 29 mEq/L (ref 19–32)
Calcium: 9.3 mg/dL (ref 8.4–10.5)
Chloride: 104 mEq/L (ref 96–112)
Creatinine, Ser: 1.3 mg/dL (ref 0.40–1.50)
GFR: 54.24 mL/min — ABNORMAL LOW (ref >60.00)
Glucose, Bld: 176 mg/dL — ABNORMAL HIGH (ref 70–99)
Potassium: 4.6 mEq/L (ref 3.5–5.1)
Sodium: 140 mEq/L (ref 135–145)
Total Bilirubin: 0.4 mg/dL (ref 0.2–1.2)
Total Protein: 6.6 g/dL (ref 6.0–8.3)

## 2018-08-03 LAB — LIPID PANEL
Cholesterol: 153 mg/dL (ref 0–200)
HDL: 31 mg/dL — ABNORMAL LOW (ref >39.00)
NonHDL: 122.34
Total CHOL/HDL Ratio: 5
Triglycerides: 323 mg/dL — ABNORMAL HIGH (ref 0.0–149.0)
VLDL: 64.6 mg/dL — ABNORMAL HIGH (ref 0.0–40.0)

## 2018-08-03 LAB — LDL CHOLESTEROL, DIRECT: Direct LDL: 84 mg/dL

## 2018-08-03 NOTE — Progress Notes (Signed)
Care Team   Patient Care Team: Helane Rima, DO as PCP - General (Family Medicine)  Subjective:   Shaun Prince, as CMA scribe.   HPI: Shaun Prince is a 73 y.o. male who presents to the emergency department with left eyes swelling.  He reports 1 to 2 hours prior to arrival, while watching TV, he felt his itching and rubbing it.  When he looked in the mirror, he saw his globe swollen and red.  He denies any associated pain.  No visual disturbance. No headache. No anticoagulation.  He has not been able to follow up with eye doctor due to opening not available with his office. He denies any vision changes at all. Feels much better and redness is almost gone from eye.  Review: taking medications as instructed, no medication side effects noted, no TIAs, no chest pain on exertion, no dyspnea on exertion, no swelling of ankles. Smoker: No.   BP Readings from Last 3 Encounters:  08/03/18 140/72  07/18/18 (!) 162/96  02/24/18 126/78   Lab Results  Component Value Date   CREATININE 1.30 08/03/2018   CREATININE 1.37 01/19/2018      Review of Systems  Constitutional: Negative for chills and fever.  HENT: Negative for hearing loss and tinnitus.   Eyes: Negative for blurred vision and double vision.  Respiratory: Negative for cough.   Cardiovascular: Negative for chest pain and palpitations.  Gastrointestinal: Negative for nausea and vomiting.  Genitourinary: Negative for dysuria and urgency.  Neurological: Negative for dizziness and headaches.  Psychiatric/Behavioral: Negative for depression and suicidal ideas.     Patient Active Problem List   Diagnosis Date Noted  . Obesity (BMI 30-39.9) 08/03/2018  . Obstructive sleep apnea 09/23/2017  . Tremor 09/28/2016  . Hyperlipidemia 09/28/2016    Social History   Tobacco Use  . Smoking status: Never Smoker  . Smokeless tobacco: Current User    Types: Snuff  Substance Use Topics  . Alcohol use: Yes    Current Outpatient  Medications:  Marland Kitchen  Multiple Vitamins-Minerals (CENTRUM ADULTS) TABS, Take by mouth., Disp: , Rfl:  .  propranolol (INDERAL) 80 MG tablet, Take 1 tablet (80 mg total) by mouth once for 1 dose., Disp: 90 tablet, Rfl: 3 .  rosuvastatin (CRESTOR) 5 MG tablet, Take 1 tablet (5 mg total) by mouth daily., Disp: 90 tablet, Rfl: 3  No Known Allergies  Objective:   VITALS: Per patient if applicable, see vitals. GENERAL: Alert, appears well and in no acute distress. HEENT: Atraumatic, conjunctiva clear, no obvious abnormalities on inspection of external nose and ears. NECK: Normal movements of the head and neck. CARDIOPULMONARY: No increased WOB. Speaking in clear sentences. I:E ratio WNL.  MS: Moves all visible extremities without noticeable abnormality. PSYCH: Pleasant and cooperative, well-groomed. Speech normal rate and rhythm. Affect is appropriate. Insight and judgement are appropriate. Attention is focused, linear, and appropriate.  NEURO: CN grossly intact. Oriented as arrived to appointment on time with no prompting. Moves both UE equally.  SKIN: No obvious lesions, wounds, erythema, or cyanosis noted on face or hands.  Depression screen PHQ 2/9 09/28/2016  Decreased Interest 0  Down, Depressed, Hopeless 0  PHQ - 2 Score 0   Assessment and Plan:   [name] was seen today for follow-up.  Diagnoses and all orders for this visit:  Pure hypercholesterolemia -     Comprehensive metabolic panel -     Lipid panel -     LDL cholesterol, direct  Obstructive  sleep apnea  Obesity (BMI 30-39.9)  Essential hypertension Comments: States that he has never had a tremor. Would like to stop Propranolol if he can.     Marland Kitchen. COVID-19 Education: The signs and symptoms of COVID-19 were discussed with the patient and how to seek care for testing if needed. The importance of social distancing was discussed today. . Reviewed expectations re: course of current medical issues. . Discussed self-management  of symptoms. . Outlined signs and symptoms indicating need for more acute intervention. . Patient verbalized understanding and all questions were answered. Marland Kitchen. Health Maintenance issues including appropriate healthy diet, exercise, and smoking avoidance were discussed with patient. . See orders for this visit as documented in the electronic medical record.  Helane RimaErica Darice Vicario, DO  Records requested if needed. Time spent: 25 minutes, of which >50% was spent in obtaining information about his symptoms, reviewing his previous labs, evaluations, and treatments, counseling him about his condition (please see the discussed topics above), and developing a plan to further investigate it; he had a number of questions which I addressed.

## 2018-08-04 ENCOUNTER — Encounter: Payer: Self-pay | Admitting: Family Medicine

## 2018-08-15 ENCOUNTER — Other Ambulatory Visit: Payer: Self-pay

## 2018-08-15 DIAGNOSIS — R7309 Other abnormal glucose: Secondary | ICD-10-CM

## 2018-08-16 ENCOUNTER — Other Ambulatory Visit: Payer: Medicare Other

## 2018-10-13 ENCOUNTER — Ambulatory Visit: Payer: Medicare Other

## 2018-11-11 ENCOUNTER — Encounter: Payer: Self-pay | Admitting: Family Medicine

## 2018-11-11 ENCOUNTER — Other Ambulatory Visit: Payer: Self-pay

## 2018-11-11 ENCOUNTER — Ambulatory Visit: Payer: Medicare Other | Admitting: Family Medicine

## 2018-11-11 VITALS — BP 122/68 | HR 67 | Temp 98.7°F | Ht 70.5 in | Wt 235.0 lb

## 2018-11-11 DIAGNOSIS — E88819 Insulin resistance, unspecified: Secondary | ICD-10-CM

## 2018-11-11 DIAGNOSIS — R7309 Other abnormal glucose: Secondary | ICD-10-CM

## 2018-11-11 DIAGNOSIS — E8881 Metabolic syndrome: Secondary | ICD-10-CM

## 2018-11-11 DIAGNOSIS — E669 Obesity, unspecified: Secondary | ICD-10-CM | POA: Diagnosis not present

## 2018-11-11 DIAGNOSIS — E7800 Pure hypercholesterolemia, unspecified: Secondary | ICD-10-CM

## 2018-11-11 LAB — POCT GLYCOSYLATED HEMOGLOBIN (HGB A1C): Hemoglobin A1C: 5.9 % — AB (ref 4.0–5.6)

## 2018-11-11 NOTE — Progress Notes (Signed)
Sanay Belmar is a 73 y.o. male is here for follow up.  History of Present Illness:   HPI: See Assessment and Plan section for Problem Based Charting of issues discussed today.   Health Maintenance Due  Topic Date Due  . INFLUENZA VACCINE  10/01/2018   Depression screen PHQ 2/9 09/28/2016  Decreased Interest 0  Down, Depressed, Hopeless 0  PHQ - 2 Score 0   PMHx, SurgHx, SocialHx, FamHx, Medications, and Allergies were reviewed in the Visit Navigator and updated as appropriate.   Patient Active Problem List   Diagnosis Date Noted  . Insulin resistance 11/13/2018  . Obesity (BMI 30-39.9) 08/03/2018  . Obstructive sleep apnea 09/23/2017  . Hyperlipidemia 09/28/2016   Social History   Tobacco Use  . Smoking status: Never Smoker  . Smokeless tobacco: Current User    Types: Snuff  Substance Use Topics  . Alcohol use: Yes  . Drug use: No   Current Medications and Allergies   Current Outpatient Medications:  Marland Kitchen  Multiple Vitamins-Minerals (CENTRUM ADULTS) TABS, Take by mouth., Disp: , Rfl:   No Known Allergies   Review of Systems   Pertinent items are noted in the HPI. Otherwise, a complete ROS is negative.  Vitals   Vitals:   11/11/18 1421  BP: 122/68  Pulse: 67  Temp: 98.7 F (37.1 C)  TempSrc: Temporal  SpO2: 95%  Weight: 235 lb (106.6 kg)  Height: 5' 10.5" (1.791 m)     Body mass index is 33.24 kg/m.  Physical Exam   Physical Exam Vitals signs and nursing note reviewed.  Constitutional:      General: He is not in acute distress.    Appearance: He is well-developed.  HENT:     Head: Normocephalic and atraumatic.     Right Ear: External ear normal.     Left Ear: External ear normal.     Nose: Nose normal.  Eyes:     Conjunctiva/sclera: Conjunctivae normal.     Pupils: Pupils are equal, round, and reactive to light.  Neck:     Musculoskeletal: Neck supple.  Cardiovascular:     Rate and Rhythm: Normal rate and regular rhythm.  Pulmonary:    Effort: Pulmonary effort is normal.  Abdominal:     General: Bowel sounds are normal.     Palpations: Abdomen is soft.  Musculoskeletal: Normal range of motion.  Skin:    General: Skin is warm.  Neurological:     Mental Status: He is alert.  Psychiatric:        Behavior: Behavior normal.    Results for orders placed or performed in visit on 11/11/18  POCT glycosylated hemoglobin (Hb A1C)  Result Value Ref Range   Hemoglobin A1C 5.9 (A) 4.0 - 5.6 %   HbA1c POC (<> result, manual entry)     HbA1c, POC (prediabetic range)     HbA1c, POC (controlled diabetic range)      Assessment and Plan   Jaykwon was seen today for fatigue.  Diagnoses and all orders for this visit:  Elevated glucose level Comments: Concern for elevated blood sugar on recent labs.  Orders: -     POCT glycosylated hemoglobin (Hb A1C)  Obesity (BMI 30-39.9) Comments: Reviewed healthy food choices, exercise. Offer RD referral.   Pure hypercholesterolemia Comments: Patient self DC'd Crestor. Encouraged him to restart.   Insulin resistance Comments: New. Long discussion re: diagnosis, potential treatment options.     . Orders and follow up as documented in  [facility], reviewed diet, exercise and weight control, cardiovascular risk and specific lipid/LDL goals reviewed, reviewed medications and side effects in detail.  . Reviewed expectations re: course of current medical issues. . Outlined signs and symptoms indicating need for more acute intervention. . Patient verbalized understanding and all questions were answered. . Patient received an After Visit Summary.   Helane RimaErica Yehuda Printup, DO [place] 11/13/2018

## 2018-11-13 ENCOUNTER — Encounter: Payer: Self-pay | Admitting: Family Medicine

## 2018-11-13 DIAGNOSIS — E88819 Insulin resistance, unspecified: Secondary | ICD-10-CM | POA: Insufficient documentation

## 2018-11-13 DIAGNOSIS — E8881 Metabolic syndrome: Secondary | ICD-10-CM | POA: Insufficient documentation

## 2018-11-22 ENCOUNTER — Telehealth: Payer: Self-pay | Admitting: Family Medicine

## 2018-11-22 NOTE — Telephone Encounter (Signed)
I left a message asking the patient to call me at [phone] to schedule AWV with [name]. I explained that there are openings on afternoon of 9/25 when he's scheduled for a flu shot. VDM (Dee-Dee)

## 2018-11-25 ENCOUNTER — Ambulatory Visit (INDEPENDENT_AMBULATORY_CARE_PROVIDER_SITE_OTHER): Payer: Medicare Other

## 2018-11-25 ENCOUNTER — Other Ambulatory Visit: Payer: Self-pay

## 2018-11-25 DIAGNOSIS — Z23 Encounter for immunization: Secondary | ICD-10-CM | POA: Diagnosis not present

## 2019-02-20 ENCOUNTER — Other Ambulatory Visit: Payer: Self-pay

## 2019-02-21 ENCOUNTER — Encounter: Payer: Self-pay | Admitting: Family Medicine

## 2019-02-21 ENCOUNTER — Ambulatory Visit (INDEPENDENT_AMBULATORY_CARE_PROVIDER_SITE_OTHER): Payer: Medicare Other | Admitting: Family Medicine

## 2019-02-21 VITALS — BP 130/80 | HR 80 | Temp 97.0°F | Ht 70.5 in | Wt 237.2 lb

## 2019-02-21 DIAGNOSIS — L820 Inflamed seborrheic keratosis: Secondary | ICD-10-CM

## 2019-02-21 DIAGNOSIS — Z0001 Encounter for general adult medical examination with abnormal findings: Secondary | ICD-10-CM | POA: Diagnosis not present

## 2019-02-21 DIAGNOSIS — E7800 Pure hypercholesterolemia, unspecified: Secondary | ICD-10-CM

## 2019-02-21 NOTE — Progress Notes (Signed)
Chief Complaint:  Shaun Prince is a 73 y.o. male who presents today for his annual comprehensive physical exam and subsequent medicare annual wellness visit.    Assessment/Plan:  Hyperlipidemia Continue lifestyle modifications. Check lipids next blood draw.   Inflamed seborrheic keratosis Cryotherapy performed today.  Tolerated well.  Preventative Healthcare: Due for colonoscopy 2025, UTD on flu vaccine, pneumonia vaccines and tdap. Does not need prostate cancer screening due to age.   Patient Counseling(The following topics were reviewed and/or handout was given):  -Nutrition: Stressed importance of moderation in sodium/caffeine intake, saturated fat and cholesterol, caloric balance, sufficient intake of fresh fruits, vegetables, and fiber.  -Stressed the importance of regular exercise.   -Substance Abuse: Discussed cessation/primary prevention of tobacco, alcohol, or other drug use; driving or other dangerous activities under the influence; availability of treatment for abuse.   -Injury prevention: Discussed safety belts, safety helmets, smoke detector, smoking near bedding or upholstery.   -Sexuality: Discussed sexually transmitted diseases, partner selection, use of condoms, avoidance of unintended pregnancy and contraceptive alternatives.   -Dental health: Discussed importance of regular tooth brushing, flossing, and dental visits.  -Health maintenance and immunizations reviewed. Please refer to Health maintenance section.  During the course of the visit the patient was educated and counseled about appropriate screening and preventive services including:        Fall prevention   Nutrition Physical Activity Weight Management Cognition  Return to care in 1 year for next preventative visit.     Subjective:  HPI:  Health Risk Assessment: Patient considers his overall health to be good. He has no difficulty performing the following: . Preparing food and eating . Bathing    . Getting dressed . Using the toilet . Shopping . Managing Finances . Moving around from place to place  He has not had any falls within the past year.   Dep[facility] screen [facility] 2[facility]9 02/21/2019  Decreased Interest 0  Down, Depressed, Hopeless 0  PHQ - 2 Score 0   Lifestyle Factors: Diet: None.  Exercise: None.   Patient Care Team: Ardith DarkParker, Cillian Gwinner M, MD as PCP - General (Family Medicine)   His chronic medical conditions are outlined below:  # Dyslipidemia - Diet Controlled  ROS: Per HPI, otherwise a complete review of systems was negative.   PMH:  The following were reviewed and entered/updated in epic: Past Medical History:  Diagnosis Date  . Hyperlipidemia   . Tremor 09/28/2016   Patient Active Problem List   Diagnosis Date Noted  . Obstructive sleep apnea 09/23/2017  . Hyperlipidemia 09/28/2016   Past Surgical History:  Procedure Laterality Date  . ADENOIDECTOMY    . BACK SURGERY N/A   . SHOULDER SURGERY Left   . TONSILLECTOMY      Family History  Problem Relation Age of Onset  . Diabetes Mother   . Heart attack Father   . Coronary artery disease Father   . Skin cancer Father   . Diabetes Maternal Grandmother   . Heart attack Paternal Grandfather     Medications- reviewed and updated Current Outpatient Medications  Medication Sig Dispense Refill  . FISH OIL-KRILL OIL PO Take by mouth.    . Multiple Vitamins-Minerals (CENTRUM ADULTS) TABS Take by mouth.    . Nutritional Supplements (VITAMIN D BOOSTER PO) Take by mouth.    . Turmeric (QC TUMERIC COMPLEX PO) Take by mouth.     No current facility-administered medications for this visit.    Allergies-reviewed and updated No Known Allergies  Social History   Socioeconomic History  . Marital status: Married    Spouse name: Not on file  . Number of children: Not on file  . Years of education: Not on file  . Highest education level: Not on file  Occupational History  . Not on file  Tobacco Use   . Smoking status: Never Smoker  . Smokeless tobacco: Current User    Types: Snuff  Substance and Sexual Activity  . Alcohol use: Yes  . Drug use: No  . Sexual activity: Not on file  Other Topics Concern  . Not on file  Social History Narrative   From [place]. Conservative. Not excited about living in [place], but moved to be near children and grandchildren whom he loves to be with. Wife is extrovert while he prefers to be home or around small group of people - misses a local bar.    Social Determinants of Health   Financial Resource Strain:   . Difficulty of Paying Living Expenses: Not on file  Food Insecurity:   . Worried About Charity fundraiser in the Last Year: Not on file  . Ran Out of Food in the Last Year: Not on file  Transportation Needs:   . Lack of Transportation (Medical): Not on file  . Lack of Transportation (Non-Medical): Not on file  Physical Activity:   . Days of Exercise per Week: Not on file  . Minutes of Exercise per Session: Not on file  Stress:   . Feeling of Stress : Not on file  Social Connections:   . Frequency of Communication with Friends and Family: Not on file  . Frequency of Social Gatherings with Friends and Family: Not on file  . Attends Religious Services: Not on file  . Active Member of Clubs or Organizations: Not on file  . Attends Archivist Meetings: Not on file  . Marital Status: Not on file        Objective:  Physical Exam: BP 130/80   Pulse 80   Temp (!) 97 F (36.1 C)   Ht 5' 10.5" (1.791 m)   Wt 237 lb 4 oz (107.6 kg)   SpO2 98%   BMI 33.56 kg/m   Body mass index is 33.56 kg/m. Wt Readings from Last 3 Encounters:  02/21/19 237 lb 4 oz (107.6 kg)  11/11/18 235 lb (106.6 kg)  08/03/18 239 lb 12.8 oz (108.8 kg)  Gen: NAD, resting comfortably HEENT: TMs normal bilaterally. OP clear. No thyromegaly noted.  CV: RRR with no murmurs appreciated Pulm: NWOB, CTAB with no crackles, wheezes, or rhonchi GI: Normal bowel  sounds present. Soft, Nontender, Nondistended. MSK: no edema, cyanosis, or clubbing noted Skin: warm, dry.  Scattered hyperkeratotic lesions on forehead and abdomen.  Neuro: CN2-12 grossly intact. Strength 5/5 in upper and lower extremities. Reflexes symmetric and intact bilaterally. Normal minicog.  Psych: Normal affect and thought content  Cryotherapy Procedure Note  Pre-operative Diagnosis: Seborrheic keratoses  Locations: Scalp, trunk  Indications: Therapeutic  Procedure Details  Patient informed of risks (permanent scarring, infection, light or dark discoloration, bleeding, infection, weakness, numbness and recurrence of the lesion) and benefits of the procedure and verbal informed consent obtained.  The areas are treated with liquid nitrogen therapy, frozen until ice ball extended 2 mm beyond lesion, allowed to thaw, and treated again.  A total of 4 lesions were treated.  The patient tolerated procedure well.  The patient was instructed on post-op care, warned that there may be  blister formation, redness and pain. Recommend OTC analgesia as needed for pain.  Condition: Stable  Complications: none.       Katina Degree. Jimmey Ralph, MD 02/21/2019 2:27 PM

## 2019-02-21 NOTE — Assessment & Plan Note (Signed)
Continue lifestyle modifications.  Check lipids next blood draw. 

## 2019-02-28 ENCOUNTER — Emergency Department (HOSPITAL_COMMUNITY)
Admission: EM | Admit: 2019-02-28 | Discharge: 2019-02-28 | Disposition: A | Discharge status: Home / Self Care | Payer: Medicare Other | Attending: Emergency Medicine | Admitting: Emergency Medicine

## 2019-02-28 ENCOUNTER — Ambulatory Visit: Payer: Self-pay | Admitting: Family Medicine

## 2019-02-28 ENCOUNTER — Emergency Department (HOSPITAL_COMMUNITY): Payer: Medicare Other

## 2019-02-28 DIAGNOSIS — F17290 Nicotine dependence, other tobacco product, uncomplicated: Secondary | ICD-10-CM | POA: Diagnosis not present

## 2019-02-28 DIAGNOSIS — Z79899 Other long term (current) drug therapy: Secondary | ICD-10-CM | POA: Insufficient documentation

## 2019-02-28 DIAGNOSIS — R42 Dizziness and giddiness: Secondary | ICD-10-CM | POA: Diagnosis not present

## 2019-02-28 LAB — CBC
HCT: 48.5 % (ref 39.0–52.0)
Hemoglobin: 15.8 g/dL (ref 13.0–17.0)
MCH: 30.7 pg (ref 26.0–34.0)
MCHC: 32.6 g/dL (ref 30.0–36.0)
MCV: 94.2 fL (ref 80.0–100.0)
Platelets: 292 10*3/uL (ref 150–400)
RBC: 5.15 MIL/uL (ref 4.22–5.81)
RDW: 12.9 % (ref 11.5–15.5)
WBC: 8.2 10*3/uL (ref 4.0–10.5)
nRBC: 0 % (ref 0.0–0.2)

## 2019-02-28 LAB — BASIC METABOLIC PANEL
Anion gap: 7 (ref 5–15)
BUN: 14 mg/dL (ref 8–23)
CO2: 26 mmol/L (ref 22–32)
Calcium: 8.9 mg/dL (ref 8.9–10.3)
Chloride: 106 mmol/L (ref 98–111)
Creatinine, Ser: 1.29 mg/dL — ABNORMAL HIGH (ref 0.61–1.24)
GFR calc Af Amer: >60 mL/min (ref >60)
GFR calc non Af Amer: 55 mL/min — ABNORMAL LOW (ref >60)
Glucose, Bld: 148 mg/dL — ABNORMAL HIGH (ref 70–99)
Potassium: 5 mmol/L (ref 3.5–5.1)
Sodium: 139 mmol/L (ref 135–145)

## 2019-02-28 LAB — TROPONIN I (HIGH SENSITIVITY)
Troponin I (High Sensitivity): 3 ng/L (ref <18)
Troponin I (High Sensitivity): 3 ng/L (ref <18)

## 2019-02-28 LAB — POC OCCULT BLOOD, ED: Fecal Occult Bld: NEGATIVE

## 2019-02-28 MED ORDER — SODIUM CHLORIDE 0.9 % IV BOLUS
1000.0000 mL | Freq: Once | INTRAVENOUS | Status: AC
  Administered 2019-02-28: 1000 mL via INTRAVENOUS

## 2019-02-28 MED ORDER — MECLIZINE HCL 12.5 MG PO TABS: 12.5000 mg | ORAL_TABLET | Freq: Three times a day (TID) | ORAL | 0 refills | Status: DC | PRN

## 2019-02-28 MED ORDER — ONDANSETRON 4 MG PO TBDP: 4.0000 mg | ORAL_TABLET | Freq: Three times a day (TID) | ORAL | 0 refills | Status: DC | PRN

## 2019-02-28 MED ORDER — SODIUM CHLORIDE 0.9% FLUSH
3.0000 mL | Freq: Once | INTRAVENOUS | Status: AC
  Administered 2019-02-28: 3 mL via INTRAVENOUS

## 2019-02-28 NOTE — ED Notes (Signed)
Nurse Navigator received a call from his wife.  Wife wants to make sure MD is aware that pt has been dizzy for 3-4 days.  Today his wife came to the living room and found pt "mumbling", she spoke with her husband and he said he was "very very dizzy" and told her that he did not think he would be able to get up.  Pt then attempted to get up but was very unsteady, he almost fell walking around the couch. Wife would like to make sure that MD is aware .

## 2019-02-28 NOTE — Telephone Encounter (Signed)
FYI

## 2019-02-28 NOTE — Discharge Instructions (Signed)
Your testing does not show evidence of a stroke.  Keep yourself hydrated and take the dizziness medication as prescribed.  Follow-up with your doctor as well as the neurologist.  Return to the ED with new or worsening symptoms.

## 2019-02-28 NOTE — ED Notes (Signed)
Pt wife is concerned about "mini stroke" due to mumbling and dizziness.  She is worried that he will minimize symptoms.

## 2019-02-28 NOTE — ED Provider Notes (Signed)
[facility] EMERGENCY DEPARTMENT Provider Note   CSN: [id number] Arrival date & time: 02/28/19  1355     History Chief Complaint  Patient presents with  . Dizziness    Shaun Prince is a 73 y.o. male.  Patient with a history of hyperlipidemia and resting tremor here with 1 week of dizziness.  Patient describes a sensation of lightheadedness and room spinning dizziness in combination.  It is worse with position change and not present when he is lying still.  He had a severe episode today which led to his wife calling EMS.  He states he feels off balance like he is walking like a drunk person.  He did not have any fall.  Apparently his wife said he was "mumbling earlier and she was concerned about a possible stroke he was generally weak and unsteady on his feet.  Patient states he was dreaming and not mumbling his speech.  He states his speech is normal now.  Denies any focal weakness.  There is no chest pain or shortness of breath.  States he has had few episodes of loose stools that have been dark he does not think they have been bloody.  Does not take blood thinners.  Did have 1 episode of vomiting today from his dizziness.  He denies any dizziness on lying still.  There is no visual change.  There is no difficulty speaking or swallowing.  No focal neuro deficits.  The history is provided by the patient and a relative.  Dizziness Associated symptoms: diarrhea, vomiting and weakness   Associated symptoms: no chest pain, no headaches, no nausea, no palpitations and no shortness of breath        Past Medical History:  Diagnosis Date  . Hyperlipidemia   . Tremor 09/28/2016    Patient Active Problem List   Diagnosis Date Noted  . Obstructive sleep apnea 09/23/2017  . Hyperlipidemia 09/28/2016    Past Surgical History:  Procedure Laterality Date  . ADENOIDECTOMY    . BACK SURGERY N/A   . SHOULDER SURGERY Left   . TONSILLECTOMY         Family History    Problem Relation Age of Onset  . Diabetes Mother   . Heart attack Father   . Coronary artery disease Father   . Skin cancer Father   . Diabetes Maternal Grandmother   . Heart attack Paternal Grandfather     Social History   Tobacco Use  . Smoking status: Never Smoker  . Smokeless tobacco: Current User    Types: Snuff  Substance Use Topics  . Alcohol use: Yes  . Drug use: No    Home Medications Prior to Admission medications   Medication Sig Start Date End Date Taking? Authorizing Provider  FISH OIL-KRILL OIL PO Take by mouth.    [provider]  Multiple Vitamins-Minerals (CENTRUM ADULTS) TABS Take by mouth.    [provider]  Nutritional Supplements (VITAMIN D BOOSTER PO) Take by mouth.    [provider]  Turmeric (QC TUMERIC COMPLEX PO) Take by mouth.    [provider]    Allergies    Patient has no known allergies.  Review of Systems   Review of Systems  Constitutional: Positive for fatigue. Negative for activity change, appetite change and fever.  HENT: Negative for congestion and rhinorrhea.   Respiratory: Negative for cough, chest tightness, shortness of breath and wheezing.   Cardiovascular: Negative for chest pain and palpitations.  Gastrointestinal: Positive  for diarrhea and vomiting. Negative for abdominal pain and nausea.  Genitourinary: Negative for dysuria and hematuria.  Musculoskeletal: Negative for arthralgias and myalgias.  Skin: Negative for rash.  Neurological: Positive for dizziness, weakness and light-headedness. Negative for headaches.   all other systems are negative except as noted in the HPI and PMH.    Physical Exam Updated Vital Signs BP 123/74   Pulse (!) 54   Temp 97.8 F (36.6 C) (Oral)   Resp 13   SpO2 100%   Physical Exam Vitals and nursing note reviewed.  Constitutional:      General: He is not in acute distress.    Appearance: Normal appearance. He is well-developed. He is obese.   HENT:     Head: Normocephalic and atraumatic.     Mouth/Throat:     Pharynx: No oropharyngeal exudate.  Eyes:     Conjunctiva/sclera: Conjunctivae normal.     Pupils: Pupils are equal, round, and reactive to light.  Neck:     Comments: No meningismus. Cardiovascular:     Rate and Rhythm: Normal rate and regular rhythm.     Heart sounds: Normal heart sounds. No murmur.  Pulmonary:     Effort: Pulmonary effort is normal. No respiratory distress.     Breath sounds: Normal breath sounds.  Chest:     Chest wall: No tenderness.  Abdominal:     Palpations: Abdomen is soft.     Tenderness: There is no abdominal tenderness. There is no guarding or rebound.  Genitourinary:    Comments: Brown stool, no gross blood Musculoskeletal:        General: No tenderness. Normal range of motion.     Cervical back: Normal range of motion and neck supple.  Skin:    General: Skin is warm.     Capillary Refill: Capillary refill takes less than 2 seconds.  Neurological:     General: No focal deficit present.     Mental Status: He is alert and oriented to person, place, and time. Mental status is at baseline.     Cranial Nerves: No cranial nerve deficit.     Motor: No abnormal muscle tone.     Coordination: Coordination normal.     Comments: No ataxia on finger to nose bilaterally. No pronator drift. 5/5 strength throughout. CN 2-12 intact.Equal grip strength. Sensation intact.   Positive Romberg, ataxic gait, no appreciable nystagmus.    Psychiatric:        Behavior: Behavior normal.     ED Results / Procedures / Treatments   Labs (all labs ordered are listed, but only abnormal results are displayed) Labs Reviewed  BASIC METABOLIC PANEL - Abnormal; Notable for the following components:      Result Value   Glucose, Bld 148 (*)    Creatinine, Ser 1.29 (*)    GFR calc non Af Amer 55 (*)    All other components within normal limits  CBC  POC OCCULT BLOOD, ED  TROPONIN I (HIGH SENSITIVITY)   TROPONIN I (HIGH SENSITIVITY)    EKG EKG Interpretation  Date/Time:  Tuesday February 28 2019 14:12:54 EST Ventricular Rate:  58 PR Interval:  168 QRS Duration: 112 QT Interval:  438 QTC Calculation: 429 R Axis:   36 Text Interpretation: Sinus bradycardia Otherwise normal ECG No previous ECGs available Confirmed by Glynn Octaveancour, Kacyn Souder [phone]([id number]) on 02/28/2019 8:09:46 PM   Radiology DG Chest 2 View  Result Date: 02/28/2019 CLINICAL DATA:  Dizziness with nausea and vomiting EXAM: CHEST -  2 VIEW COMPARISON:  None. FINDINGS: There is slight left base atelectasis. Lungs elsewhere are clear. Heart is upper normal in size with pulmonary vascularity normal. No adenopathy. There is degenerative change in the lower thoracic region. IMPRESSION: Slight left base atelectasis. No edema or consolidation. Heart upper normal in size. No evident adenopathy. Electronically Signed   By: Lowella Grip III M.D.   On: 02/28/2019 14:42   MR BRAIN WO CONTRAST  Result Date: 02/28/2019 CLINICAL DATA:  Acute presentation with dizziness and vertigo. EXAM: MRI HEAD WITHOUT CONTRAST TECHNIQUE: Multiplanar, multiecho pulse sequences of the brain and surrounding structures were obtained without intravenous contrast. COMPARISON:  None. FINDINGS: Brain: Diffusion imaging does not show any acute or subacute infarction. There are chronic small-vessel ischemic changes of the pons. No focal cerebellar abnormality. Cerebral hemispheres show moderate chronic small-vessel ischemic changes of the deep and subcortical white matter. No large vessel territory infarction. No mass lesion, hemorrhage, hydrocephalus or extra-axial collection. Vascular: Major vessels at the base of the brain show flow. Skull and upper cervical spine: With negative Sinuses/Orbits: Clear/normal Other: None IMPRESSION: No acute finding to explain the clinical presentation. Moderate chronic small-vessel ischemic changes affecting the brain as outlined above.  Electronically Signed   By: Nelson Chimes M.D.   On: 02/28/2019 21:47    Procedures Procedures (including critical care time)  Medications Ordered in ED Medications  sodium chloride flush (NS) 0.9 % injection 3 mL (has no administration in time range)  sodium chloride 0.9 % bolus 1,000 mL (has no administration in time range)    ED Course  I have reviewed the triage vital signs and the nursing notes.  Pertinent labs & imaging results that were available during my care of the patient were reviewed by me and considered in my medical decision making (see chart for details).    MDM Rules/Calculators/A&P                      Dizziness that is positional with both room spinning and lightheadedness.  Some dark stools but this is his baseline.  No lateralizing neurological deficits.  EKG sinus rhythm. Troponin negative. Orthostatics negative.  Hemoglobin stable. FOBT negative.   Doubt significant GI bleed with stable vitals, FOBT negative, and stable hemoglobin. Labs reassuring. Troponin negative x2.   MRI negative for infarct or other acute pathology.  Patient tolerating PO and ambulatory.   Will treat symptomatically with meclizine.  Follow up with PCP.  Return precautions discussed.   Final Clinical Impression(s) / ED Diagnoses Final diagnoses:  Dizziness  Vertigo    Rx / DC Orders ED Discharge Orders    None       Nori Winegar, Annie Main, MD 03/01/19 0104

## 2019-02-28 NOTE — ED Notes (Signed)
[place] pts wife would like an update on husband

## 2019-02-28 NOTE — ED Triage Notes (Signed)
Pt here from home with c/o dizziness and htn along with some dark stools , pt received 500 of fluid from ems , b/p down to 168/94 on arrival  to the ED , cbg 141

## 2019-02-28 NOTE — ED Notes (Signed)
Pt ambulated in room with no assistance 

## 2019-02-28 NOTE — Telephone Encounter (Signed)
Pt's wife calling, pt present. States dizziness x 3-4 days, severe.  Has appt with Dr. Jerline Pain tomorrow, "Thought it was his ear." Denies any earache, no sinus pain, drainage. Does not check BP, no new medications. Reports dizziness at rest, sitting, and off balance when up "Feels like I will fall." Reports nausea. Wife states she heard pt "Mumbling" attempted to wake him, difficult and pt stated "I thought I was dreaming." CAnnot get up without assistance. Directed To ED via EMS. Wife states she will call now. Reason for Disposition . Difficult to awaken or acting confused (e.g., disoriented, slurred speech)  Answer Assessment - Initial Assessment Questions 1. DESCRIPTION: "Describe your dizziness."     Lightheadedness 2. VERTIGO: "Do you feel like either you or the room is spinning or tilting?"     No 3. LIGHTHEADED: "Do you feel lightheaded?" (e.g., somewhat faint, woozy, weak upon standing)     yes 4. SEVERITY: "How bad is it?"  "Can you walk?"   - MILD - Feels unsteady but walking normally.   - MODERATE - Feels very unsteady when walking, but not falling; interferes with normal activities (e.g., school, work) .   - SEVERE - Unable to walk without falling (requires assistance).     severe 5. ONSET:  "When did the dizziness begin?"     3-4 days ago 6. AGGRAVATING FACTORS: "Does anything make it worse?" (e.g., standing, change in head position)     At rest 7. CAUSE: "What do you think is causing the dizziness?"     unsure 8. RECURRENT SYMPTOM: "Have you had dizziness before?" If so, ask: "When was the last time?" "What happened that time?"     no 9. OTHER SYMPTOMS: "Do you have any other symptoms?" (e.g., headache, weakness, numbness, vomiting, earache)    Nausea,  Protocols used: DIZZINESS - VERTIGO-A-AH

## 2019-02-28 NOTE — Telephone Encounter (Signed)
Noted. Agree with ED dispo.  Algis Greenhouse. Jerline Pain, MD 02/28/2019 2:06 PM

## 2019-03-01 ENCOUNTER — Encounter: Payer: Self-pay | Admitting: Family Medicine

## 2019-03-01 ENCOUNTER — Other Ambulatory Visit: Payer: Self-pay

## 2019-03-01 ENCOUNTER — Ambulatory Visit (INDEPENDENT_AMBULATORY_CARE_PROVIDER_SITE_OTHER): Payer: Medicare Other | Admitting: Family Medicine

## 2019-03-01 VITALS — BP 136/82 | HR 76 | Temp 97.8°F | Ht 70.5 in | Wt 240.2 lb

## 2019-03-01 DIAGNOSIS — R42 Dizziness and giddiness: Secondary | ICD-10-CM | POA: Diagnosis not present

## 2019-03-01 NOTE — Patient Instructions (Addendum)
It was very nice to see you today!  I am glad that you are feeling better.  Please stay well-hydrated.  Take meclizine if needed.  You do not need any other pneumonia vaccines.  Please come back for the Covid antibody test if you would like.  Take care, Dr Jerline Pain  Please try these tips to maintain a healthy lifestyle:   Eat at least 3 REAL meals and 1-2 snacks per day.  Aim for no more than 5 hours between eating.  If you eat breakfast, please do so within one hour of getting up.    Each meal should contain half fruits/vegetables, one quarter protein, and one quarter carbs (no bigger than a computer mouse)   Cut down on sweet beverages. This includes juice, soda, and sweet tea.     Drink at least 1 glass of water with each meal and aim for at least 8 glasses per day   Exercise at least 150 minutes every week.

## 2019-03-01 NOTE — Progress Notes (Signed)
Chief Complaint:  Shaun Prince is a 73 y.o. male who presents today with a chief complaint of ED follow up.   Assessment/Plan:  Dizziness Seems to be improving.  Possibly due to dehydration.  Recommend continuing oral hydration as he has been doing.  He will also start on his meclizine.  Discussed reasons to return to care. Follow up as needed.     Subjective:  HPI:  Dizziness  Patient started having dizziness starting a week ago. Become severe yesterday and his wife called EMS. Went to the ED and had work up including MRI and labs which were unremarkable.  He was started on meclizine. Symptoms have improved today. Has been trying to take more food and fluids. Still has mild dizziness. No weakness or numbness.   ROS: Per HPI  PMH: He reports that he has never smoked. His smokeless tobacco use includes snuff. He reports current alcohol use. He reports that he does not use drugs.      Objective:  Physical Exam: BP 136/82   Pulse 76   Temp 97.8 F (36.6 C)   Ht 5' 10.5" (1.791 m)   Wt 240 lb 3.2 oz (109 kg)   SpO2 97%   BMI 33.98 kg/m   Gen: NAD, resting comfortably HEENT: TMs clear bilaterally.  CV: Regular rate and rhythm with no murmurs appreciated Pulm: Normal work of breathing, clear to auscultation bilaterally with no crackles, wheezes, or rhonchi Neuro: Grossly normal, moves all extremities Psych: Normal affect and thought content  Results for orders placed or performed during the hospital encounter of 02/28/19 (from the past 24 hour(s))  Basic metabolic panel     Status: Abnormal   Collection Time: 02/28/19  2:09 PM  Result Value Ref Range   Sodium 139 135 - 145 mmol/L   Potassium 5.0 3.5 - 5.1 mmol/L   Chloride 106 98 - 111 mmol/L   CO2 26 22 - 32 mmol/L   Glucose, Bld 148 (H) 70 - 99 mg/dL   BUN 14 8 - 23 mg/dL   Creatinine, Ser 1.29 (H) 0.61 - 1.24 mg/dL   Calcium 8.9 8.9 - 10.3 mg/dL   GFR calc non Af Amer 55 (L) >60 mL/min   GFR calc Af Amer >60 >60  mL/min   Anion gap 7 5 - 15  CBC     Status: None   Collection Time: 02/28/19  2:09 PM  Result Value Ref Range   WBC 8.2 4.0 - 10.5 K/uL   RBC 5.15 4.22 - 5.81 MIL/uL   Hemoglobin 15.8 13.0 - 17.0 g/dL   HCT 48.5 39.0 - 52.0 %   MCV 94.2 80.0 - 100.0 fL   MCH 30.7 26.0 - 34.0 pg   MCHC 32.6 30.0 - 36.0 g/dL   RDW 12.9 11.5 - 15.5 %   Platelets 292 150 - 400 K/uL   nRBC 0.0 0.0 - 0.2 %  Troponin I (High Sensitivity)     Status: None   Collection Time: 02/28/19  2:09 PM  Result Value Ref Range   Troponin I (High Sensitivity) 3 <18 ng/L  Troponin I (High Sensitivity)     Status: None   Collection Time: 02/28/19  6:30 PM  Result Value Ref Range   Troponin I (High Sensitivity) 3 <18 ng/L  POC occult blood, ED Provider will collect     Status: None   Collection Time: 02/28/19  8:54 PM  Result Value Ref Range   Fecal Occult Bld NEGATIVE NEGATIVE  Katina Degree. Jimmey Ralph, MD 03/01/2019 9:36 AM

## 2019-03-12 ENCOUNTER — Encounter: Payer: Self-pay | Admitting: Family Medicine

## 2019-03-17 ENCOUNTER — Other Ambulatory Visit: Payer: Self-pay

## 2019-03-17 DIAGNOSIS — R42 Dizziness and giddiness: Secondary | ICD-10-CM

## 2019-04-10 ENCOUNTER — Ambulatory Visit: Payer: Medicare Other | Attending: Family Medicine

## 2019-04-10 DIAGNOSIS — Z23 Encounter for immunization: Secondary | ICD-10-CM | POA: Insufficient documentation

## 2019-04-10 NOTE — Progress Notes (Signed)
   Covid-19 Vaccination Clinic  Name:  Shaun Prince    MRN: [id number] DOB: [birth year]  04/10/2019  [name] was observed post Covid-19 immunization for 15 minutes without incidence. He was provided with Vaccine Information Sheet and instruction to access the V-Safe system.   [name] was instructed to call 911 with any severe reactions post vaccine: Marland Kitchen Difficulty breathing  . Swelling of your face and throat  . A fast heartbeat  . A bad rash all over your body  . Dizziness and weakness    Immunizations Administered    Name Date Dose VIS Date Route   Pfizer COVID-19 Vaccine 04/10/2019  5:48 PM 0.3 mL 02/10/2019 Intramuscular   Manufacturer: [employer], [employer]   Lot: [id number]   NDC: [address]-3

## 2019-05-05 ENCOUNTER — Ambulatory Visit: Payer: Medicare Other | Attending: Internal Medicine

## 2019-05-05 DIAGNOSIS — Z23 Encounter for immunization: Secondary | ICD-10-CM | POA: Insufficient documentation

## 2019-05-05 NOTE — Progress Notes (Signed)
   Covid-19 Vaccination Clinic  Name:  Swain Acree    MRN: [id number] DOB: 21-Nov-1945  05/05/2019  Mr. Craney was observed post Covid-19 immunization for 15 minutes without incident. He was provided with Vaccine Information Sheet and instruction to access the V-Safe system.   Mr. Kawahara was instructed to call 911 with any severe reactions post vaccine: Marland Kitchen Difficulty breathing  . Swelling of face and throat  . A fast heartbeat  . A bad rash all over body  . Dizziness and weakness   Immunizations Administered    Name Date Dose VIS Date Route   Pfizer COVID-19 Vaccine 05/05/2019  5:47 PM 0.3 mL 02/10/2019 Intramuscular   Manufacturer: [employer], [employer]   Lot: [id number]   NDC: [address]-1

## 2019-10-16 ENCOUNTER — Other Ambulatory Visit: Payer: Self-pay

## 2019-10-16 ENCOUNTER — Encounter: Payer: Self-pay | Admitting: Family

## 2019-10-16 ENCOUNTER — Ambulatory Visit: Payer: Medicare Other | Admitting: Family

## 2019-10-16 VITALS — BP 122/84 | HR 80 | Temp 98.1°F | Ht 70.5 in | Wt 227.0 lb

## 2019-10-16 DIAGNOSIS — K0889 Other specified disorders of teeth and supporting structures: Secondary | ICD-10-CM | POA: Diagnosis not present

## 2019-10-16 MED ORDER — AMOXICILLIN-POT CLAVULANATE 875-125 MG PO TABS: 1.0000 | ORAL_TABLET | Freq: Two times a day (BID) | ORAL | 0 refills | Status: AC

## 2019-10-16 NOTE — Progress Notes (Signed)
Shaun Prince is a 74 y.o. male with the following history as recorded in EpicCare:  Patient Active Problem List   Diagnosis Date Noted   Obstructive sleep apnea 09/23/2017   Hyperlipidemia 09/28/2016    Current Outpatient Medications  Medication Sig Dispense Refill   FISH OIL-KRILL OIL PO Take by mouth.     meclizine (ANTIVERT) 12.5 MG tablet Take 1 tablet (12.5 mg total) by mouth 3 (three) times daily as needed for dizziness. 30 tablet 0   Multiple Vitamins-Minerals (CENTRUM ADULTS) TABS Take by mouth.     Nutritional Supplements (VITAMIN D BOOSTER PO) Take by mouth.     ondansetron (ZOFRAN ODT) 4 MG disintegrating tablet Take 1 tablet (4 mg total) by mouth every 8 (eight) hours as needed for nausea or vomiting. 20 tablet 0   Turmeric (QC TUMERIC COMPLEX PO) Take by mouth.     amoxicillin-clavulanate (AUGMENTIN) 875-125 MG tablet Take 1 tablet by mouth 2 (two) times daily for 10 days. 20 tablet 0   No current facility-administered medications for this visit.    Allergies: Patient has no known allergies.  Past Medical History:  Diagnosis Date   Hyperlipidemia    Tremor 09/28/2016    Past Surgical History:  Procedure Laterality Date   ADENOIDECTOMY     BACK SURGERY N/A    SHOULDER SURGERY Left    TONSILLECTOMY      Family History  Problem Relation Age of Onset   Diabetes Mother    Heart attack Father    Coronary artery disease Father    Skin cancer Father    Diabetes Maternal Grandmother    Heart attack Paternal Grandfather     Social History   Tobacco Use   Smoking status: Never Smoker   Smokeless tobacco: Current User    Types: Snuff  Substance Use Topics   Alcohol use: Yes    Subjective:  Seen for U/C visit as his PCP's office was full today; Left sided facial swelling/ left ear pain x 3 days; admits that overdue to see his dentist- is questioning if he could have a dental infection; no fever; has been having pain opening his mouth  completely- does seem better today;   Objective:  Vitals:   10/16/19 1405  BP: 122/84  Pulse: 80  Temp: 98.1 F (36.7 C)  TempSrc: Oral  SpO2: 97%  Weight: 227 lb (103 kg)  Height: 5' 10.5" (1.791 m)    General: Well developed, well nourished, in no acute distress  Skin : Warm and dry.  Head: Normocephalic and atraumatic  Eyes: Sclera and conjunctiva clear; pupils round and reactive to light; extraocular movements intact  Ears: External normal; canals clear; tympanic membranes normal  Oropharynx: Pink, supple. No suspicious lesions  Neck: Supple without thyromegaly, adenopathy  Lungs: Respirations unlabored; clear to auscultation bilaterally without wheeze, rales, rhonchi  Neurologic: Alert and oriented; speech intact; face symmetrical; moves all extremities well; CNII-XII intact without focal deficit  Facial swelling noted over left upper cheek  Assessment:  1. Pain, dental     Plan:  Suspect ear pain is related to dental issue; TM is congested but not infected; will start Augmentin 875 mg bid x 10 days; encouraged to see his dentist as soon as possible; Follow-up with his PCP for continued care/ follow up if these symptoms persist.  This visit occurred during the SARS-CoV-2 public health emergency.  Safety protocols were in place, including screening questions prior to the visit, additional usage of staff PPE, and  extensive cleaning of exam room while observing appropriate contact time as indicated for disinfecting solutions.      No follow-ups on file.  No orders of the defined types were placed in this encounter.   Requested Prescriptions   Signed Prescriptions Disp Refills   amoxicillin-clavulanate (AUGMENTIN) 875-125 MG tablet 20 tablet 0    Sig: Take 1 tablet by mouth 2 (two) times daily for 10 days.

## 2019-10-17 ENCOUNTER — Other Ambulatory Visit: Payer: Self-pay

## 2019-10-17 ENCOUNTER — Other Ambulatory Visit: Payer: Medicare Other

## 2019-10-17 DIAGNOSIS — Z20822 Contact with and (suspected) exposure to covid-19: Secondary | ICD-10-CM

## 2019-10-18 LAB — NOVEL CORONAVIRUS, NAA: SARS-CoV-2, NAA: NOT DETECTED

## 2019-10-18 LAB — SARS-COV-2, NAA 2 DAY TAT

## 2020-01-17 ENCOUNTER — Ambulatory Visit (INDEPENDENT_AMBULATORY_CARE_PROVIDER_SITE_OTHER): Payer: Medicare Other | Admitting: Otolaryngology

## 2020-01-17 ENCOUNTER — Other Ambulatory Visit: Payer: Self-pay

## 2020-01-17 ENCOUNTER — Other Ambulatory Visit (INDEPENDENT_AMBULATORY_CARE_PROVIDER_SITE_OTHER): Payer: Self-pay

## 2020-01-17 ENCOUNTER — Encounter (INDEPENDENT_AMBULATORY_CARE_PROVIDER_SITE_OTHER): Payer: Self-pay | Admitting: Otolaryngology

## 2020-01-17 VITALS — Temp 97.0°F

## 2020-01-17 DIAGNOSIS — G4733 Obstructive sleep apnea (adult) (pediatric): Secondary | ICD-10-CM | POA: Diagnosis not present

## 2020-01-17 DIAGNOSIS — J342 Deviated nasal septum: Secondary | ICD-10-CM

## 2020-01-17 NOTE — Progress Notes (Signed)
sleep 

## 2020-01-17 NOTE — Progress Notes (Addendum)
HPI: Shaun Prince is a 74 y.o. male who presents for evaluation of deviated septum and obstructive sleep apnea.  Patient had recent dental procedure with Dr. Jeanice Lim and he told him that he had a severely deviated septum.  He moved to [place] about 5 years ago.  Prior to moving here he had a sleep test performed that showed sleep apnea and they recommended use of CPAP but he never pursued this as he did not tolerate the CPAP.  He comes here today at the urging of his wife.  He does not complain about much trouble breathing through his nose. Since the sleep test was performed about 7 years ago he has not gained that much weight but again has not lost weight. He presents today with his wife.  Past Medical History:  Diagnosis Date  . Hyperlipidemia   . Tremor 09/28/2016   Past Surgical History:  Procedure Laterality Date  . ADENOIDECTOMY    . BACK SURGERY N/A   . SHOULDER SURGERY Left   . TONSILLECTOMY     Social History   Socioeconomic History  . Marital status: Married    Spouse name: Not on file  . Number of children: Not on file  . Years of education: Not on file  . Highest education level: Not on file  Occupational History  . Not on file  Tobacco Use  . Smoking status: Never Smoker  . Smokeless tobacco: Current User    Types: Snuff  Vaping Use  . Vaping Use: Never used  Substance and Sexual Activity  . Alcohol use: Yes  . Drug use: No  . Sexual activity: Not on file  Other Topics Concern  . Not on file  Social History Narrative   From [place]. Conservative. Not excited about living in [place], but moved to be near children and grandchildren whom he loves to be with. Wife is extrovert while he prefers to be home or around small group of people - misses a local bar.    Social Determinants of Health   Financial Resource Strain:   . Difficulty of Paying Living Expenses: Not on file  Food Insecurity:   . Worried About Programme researcher, broadcasting/film/video in the Last Year: Not on file  . Ran  Out of Food in the Last Year: Not on file  Transportation Needs:   . Lack of Transportation (Medical): Not on file  . Lack of Transportation (Non-Medical): Not on file  Physical Activity:   . Days of Exercise per Week: Not on file  . Minutes of Exercise per Session: Not on file  Stress:   . Feeling of Stress : Not on file  Social Connections:   . Frequency of Communication with Friends and Family: Not on file  . Frequency of Social Gatherings with Friends and Family: Not on file  . Attends Religious Services: Not on file  . Active Member of Clubs or Organizations: Not on file  . Attends Banker Meetings: Not on file  . Marital Status: Not on file   Family History  Problem Relation Age of Onset  . Diabetes Mother   . Heart attack Father   . Coronary artery disease Father   . Skin cancer Father   . Diabetes Maternal Grandmother   . Heart attack Paternal Grandfather    No Known Allergies Prior to Admission medications   Medication Sig Start Date End Date Taking? Authorizing Provider  FISH OIL-KRILL OIL PO Take by mouth.   Yes [provider]  meclizine (ANTIVERT) 12.5 MG tablet Take 1 tablet (12.5 mg total) by mouth 3 (three) times daily as needed for dizziness. 02/28/19  Yes Rancour, Jeannett Senior, MD  Multiple Vitamins-Minerals (CENTRUM ADULTS) TABS Take by mouth.   Yes [provider]  Nutritional Supplements (VITAMIN D BOOSTER PO) Take by mouth.   Yes [provider]  ondansetron (ZOFRAN ODT) 4 MG disintegrating tablet Take 1 tablet (4 mg total) by mouth every 8 (eight) hours as needed for nausea or vomiting. 02/28/19  Yes Rancour, Jeannett Senior, MD  Turmeric (QC TUMERIC COMPLEX PO) Take by mouth.   Yes [provider]     Positive ROS: Otherwise negative  All other systems have been reviewed and were otherwise negative with the exception of those mentioned in the HPI and as above.  Physical Exam: Constitutional: Alert, well-appearing,  no acute distress.  He is moderately obese. Ears: External ears without lesions or tenderness. Ear canals are clear bilaterally with intact, clear TMs.  Nasal: External nose is deviated to the right.  Intranasal exam reveals severe deviation of the septum to the left.  He is able to breathe well through the right nostril but has limited breathing through the left nostril. Oral: Lips and gums without lesions. Tongue and palate mucosa without lesions. Posterior oropharynx clear.  Patient is status post tonsillectomy. Neck: No palpable adenopathy or masses Respiratory: Breathing comfortably  Skin: No facial/neck lesions or rash noted.  Procedures  Assessment: Deviated septum with left-sided nasal obstruction as well as external nasal deviation. History of obstructive sleep apnea that has not been treated.  Plan: I discussed with patient and his wife concerning obtaining sleep test to evaluate the degree of obstructive sleep apnea.  They will also try to get the results of his previous sleep test performed 7 years ago. Briefly discussed use of CPAP for treatment of obstructive sleep apnea. Also discussed surgical options to treat the deviated nose which will require septoplasty including nasal fractures in order to straighten the nose.  If he has obstructive sleep apnea he will need to stay overnight in the hospital as his nose will be packed overnight. They will call us back following the sleep test concerning results.  Discussed with him that the only treatment for sleep apnea would be nasal CPAP but he is not sure he wants to use this.  Narda Bonds, MD

## 2020-03-05 ENCOUNTER — Ambulatory Visit (HOSPITAL_BASED_OUTPATIENT_CLINIC_OR_DEPARTMENT_OTHER): Payer: Medicare Other | Attending: Otolaryngology | Admitting: Internal Medicine

## 2021-01-06 ENCOUNTER — Ambulatory Visit: Payer: Medicare Other | Attending: Internal Medicine

## 2021-01-06 ENCOUNTER — Other Ambulatory Visit: Payer: Self-pay

## 2021-01-06 ENCOUNTER — Other Ambulatory Visit (HOSPITAL_BASED_OUTPATIENT_CLINIC_OR_DEPARTMENT_OTHER): Payer: Self-pay

## 2021-01-06 DIAGNOSIS — Z23 Encounter for immunization: Secondary | ICD-10-CM

## 2021-01-06 MED ORDER — PFIZER COVID-19 VAC BIVALENT 30 MCG/0.3ML IM SUSP
INTRAMUSCULAR | 0 refills | Status: DC
  Filled 2021-01-06: qty 0.3, 1d supply, fill #0

## 2021-01-06 NOTE — Progress Notes (Signed)
   Covid-19 Vaccination Clinic  Name:  Shaun Prince    MRN: [id number] DOB: Nov 07, 1945  01/06/2021  [name] was observed post Covid-19 immunization for 15 minutes without incident. He was provided with Vaccine Information Sheet and instruction to access the V-Safe system.   [name] was instructed to call 911 with any severe reactions post vaccine: Difficulty breathing  Swelling of face and throat  A fast heartbeat  A bad rash all over body  Dizziness and weakness   Immunizations Administered     Name Date Dose VIS Date Route   Pfizer Covid-19 Vaccine Bivalent Booster 01/06/2021  2:59 PM 0.3 mL 10/30/2020 Intramuscular   Manufacturer: [employer], [employer]   Lot: [id number]   NDC: [phone]

## 2021-02-18 IMAGING — CR DG CHEST 2V
2 series · 2 of 2 positions shown · non-contrast
Comparison: None.

CLINICAL DATA: Dizziness with nausea and vomiting

EXAM:
CHEST - 2 VIEW

[chest lat]
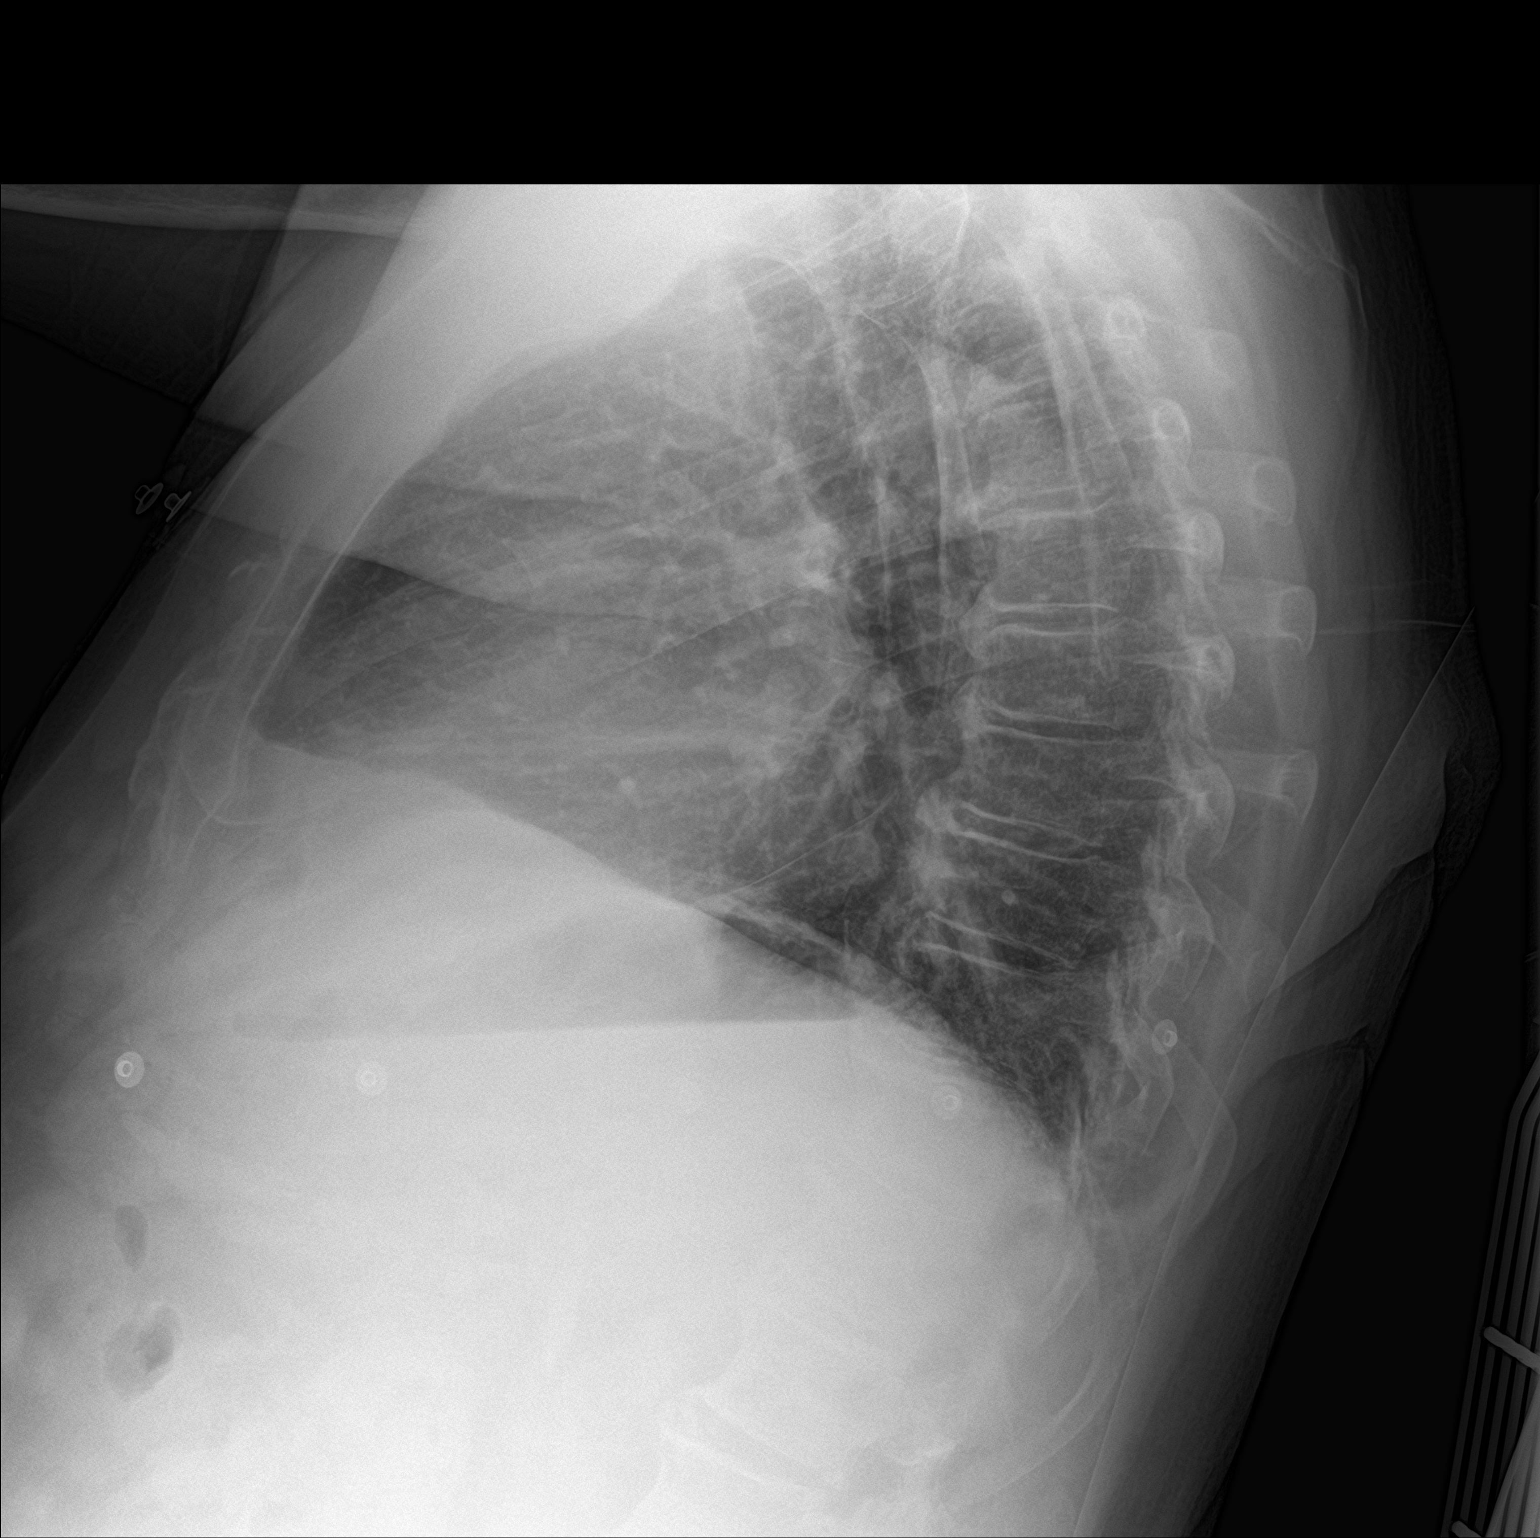

[chest ap]
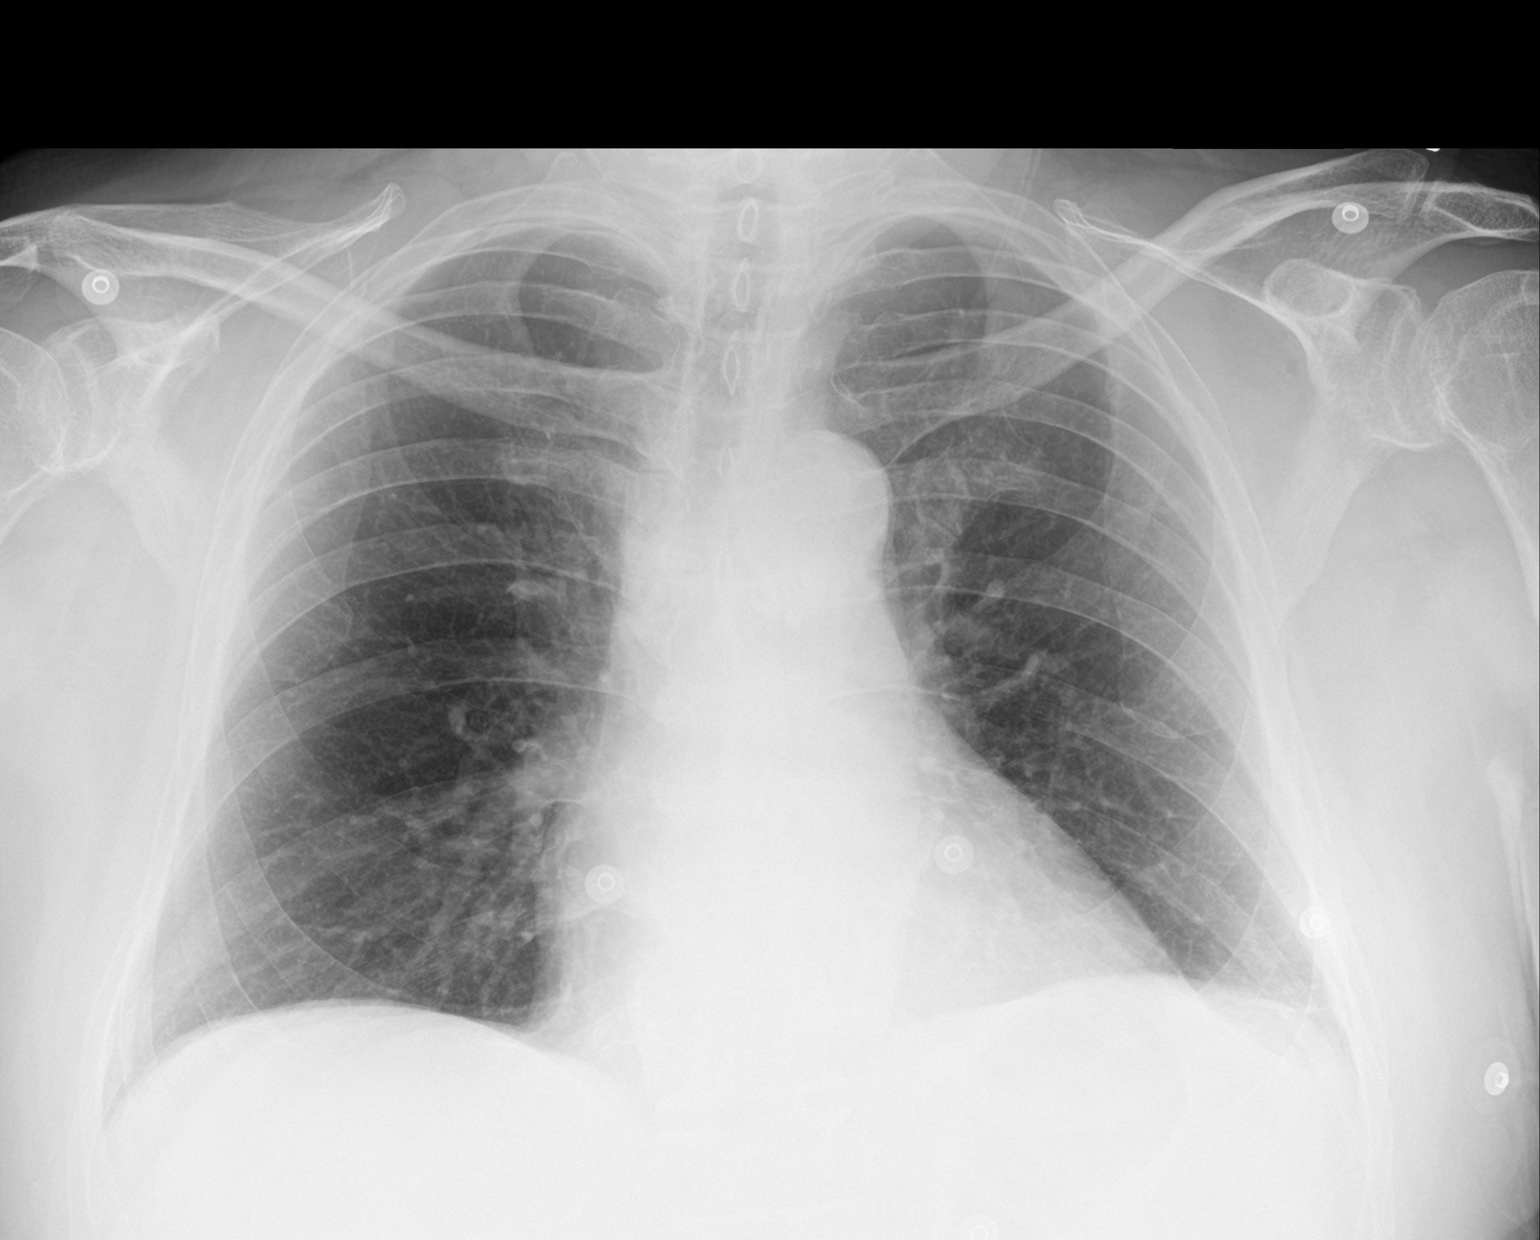

[2 of 2 positions shown; findings below may reference images not displayed]

FINDINGS: There is slight left base atelectasis. Lungs elsewhere are clear.
Heart is upper normal in size with pulmonary vascularity normal. No
adenopathy. There is degenerative change in the lower thoracic
region.
IMPRESSION: Slight left base atelectasis. No edema or consolidation. Heart upper
normal in size. No evident adenopathy.

## 2021-09-23 ENCOUNTER — Encounter: Payer: Self-pay | Admitting: Family Medicine

## 2021-09-23 ENCOUNTER — Ambulatory Visit (INDEPENDENT_AMBULATORY_CARE_PROVIDER_SITE_OTHER): Payer: Medicare Other | Admitting: Family Medicine

## 2021-09-23 VITALS — BP 121/72 | HR 70 | Temp 97.6°F | Ht 70.0 in | Wt 232.6 lb

## 2021-09-23 DIAGNOSIS — L570 Actinic keratosis: Secondary | ICD-10-CM

## 2021-09-23 DIAGNOSIS — R739 Hyperglycemia, unspecified: Secondary | ICD-10-CM | POA: Diagnosis not present

## 2021-09-23 DIAGNOSIS — Z0001 Encounter for general adult medical examination with abnormal findings: Secondary | ICD-10-CM

## 2021-09-23 DIAGNOSIS — E7800 Pure hypercholesterolemia, unspecified: Secondary | ICD-10-CM | POA: Diagnosis not present

## 2021-09-23 LAB — CBC
HCT: 46.8 % (ref 39.0–52.0)
Hemoglobin: 15.6 g/dL (ref 13.0–17.0)
MCHC: 33.2 g/dL (ref 30.0–36.0)
MCV: 92.2 fl (ref 78.0–100.0)
Platelets: 244 10*3/uL (ref 150.0–400.0)
RBC: 5.07 Mil/uL (ref 4.22–5.81)
RDW: 13.4 % (ref 11.5–15.5)
WBC: 5.8 10*3/uL (ref 4.0–10.5)

## 2021-09-23 LAB — LIPID PANEL
Cholesterol: 197 mg/dL (ref 0–200)
HDL: 31.6 mg/dL — ABNORMAL LOW (ref >39.00)
NonHDL: 165.55
Total CHOL/HDL Ratio: 6
Triglycerides: 301 mg/dL — ABNORMAL HIGH (ref 0.0–149.0)
VLDL: 60.2 mg/dL — ABNORMAL HIGH (ref 0.0–40.0)

## 2021-09-23 LAB — COMPREHENSIVE METABOLIC PANEL
ALT: 32 U/L (ref 0–53)
AST: 21 U/L (ref 0–37)
Albumin: 4.2 g/dL (ref 3.5–5.2)
Alkaline Phosphatase: 41 U/L (ref 39–117)
BUN: 21 mg/dL (ref 6–23)
CO2: 28 mEq/L (ref 19–32)
Calcium: 9.4 mg/dL (ref 8.4–10.5)
Chloride: 102 mEq/L (ref 96–112)
Creatinine, Ser: 1.42 mg/dL (ref 0.40–1.50)
GFR: 48.41 mL/min — ABNORMAL LOW (ref >60.00)
Glucose, Bld: 247 mg/dL — ABNORMAL HIGH (ref 70–99)
Potassium: 4.6 mEq/L (ref 3.5–5.1)
Sodium: 138 mEq/L (ref 135–145)
Total Bilirubin: 0.3 mg/dL (ref 0.2–1.2)
Total Protein: 7.1 g/dL (ref 6.0–8.3)

## 2021-09-23 LAB — HEMOGLOBIN A1C: Hgb A1c MFr Bld: 6.5 % (ref 4.6–6.5)

## 2021-09-23 LAB — LDL CHOLESTEROL, DIRECT: Direct LDL: 131 mg/dL

## 2021-09-23 LAB — TSH: TSH: 2.11 u[IU]/mL (ref 0.35–5.50)

## 2021-09-23 NOTE — Assessment & Plan Note (Signed)
Cryotherapy applied today.  See below procedure note.  He tolerated well. 

## 2021-09-23 NOTE — Progress Notes (Signed)
Chief Complaint:  Shaun Prince is a 76 y.o. male who presents today for his annual comprehensive physical exam.    Assessment/Plan:  Chronic Problems Addressed Today: Hyperlipidemia Check lipids. Discussed lifestyle modifications.   Actinic keratosis Cryotherapy applied today.  See below procedure note.  He tolerated well.  Preventative Healthcare: Check labs.  Up-to-date on vaccines.  Due for next colonoscopy in 6 years.  Patient Counseling(The following topics were reviewed and/or handout was given):  -Nutrition: Stressed importance of moderation in sodium/caffeine intake, saturated fat and cholesterol, caloric balance, sufficient intake of fresh fruits, vegetables, and fiber.  -Stressed the importance of regular exercise.   -Substance Abuse: Discussed cessation/primary prevention of tobacco, alcohol, or other drug use; driving or other dangerous activities under the influence; availability of treatment for abuse.   -Injury prevention: Discussed safety belts, safety helmets, smoke detector, smoking near bedding or upholstery.   -Sexuality: Discussed sexually transmitted diseases, partner selection, use of condoms, avoidance of unintended pregnancy and contraceptive alternatives.   -Dental health: Discussed importance of regular tooth brushing, flossing, and dental visits.  -Health maintenance and immunizations reviewed. Please refer to Health maintenance section.  Return to care in 1 year for next preventative visit.     Subjective:  HPI:  He has no acute complaints today. See a/p for status of chronic conditions.  He has a rough scaly lesion on his mid chest that he would like to have frozen today.    Lifestyle Diet: None specific.  Exercise: None specific.      09/23/2021    1:00 PM  Depression screen PHQ 2/9  Decreased Interest 0  Down, Depressed, Hopeless 0  PHQ - 2 Score 0    Health Maintenance Due  Topic Date Due   Zoster Vaccines- Shingrix (1 of 2) Never  done     ROS: Per HPI, otherwise a complete review of systems was negative.   PMH:  The following were reviewed and entered/updated in epic: Past Medical History:  Diagnosis Date   Hyperlipidemia    Tremor 09/28/2016   Patient Active Problem List   Diagnosis Date Noted   Actinic keratosis 09/23/2021   Obstructive sleep apnea 09/23/2017   Hyperlipidemia 09/28/2016   Past Surgical History:  Procedure Laterality Date   ADENOIDECTOMY     BACK SURGERY N/A    SHOULDER SURGERY Left    TONSILLECTOMY      Family History  Problem Relation Age of Onset   Diabetes Mother    Heart attack Father    Coronary artery disease Father    Skin cancer Father    Diabetes Maternal Grandmother    Heart attack Paternal Grandfather     Medications- reviewed and updated Current Outpatient Medications  Medication Sig Dispense Refill   FISH OIL-KRILL OIL PO Take by mouth.     Multiple Vitamins-Minerals (CENTRUM ADULTS) TABS Take by mouth.     Nutritional Supplements (VITAMIN D BOOSTER PO) Take by mouth.     Turmeric (QC TUMERIC COMPLEX PO) Take by mouth.     No current facility-administered medications for this visit.    Allergies-reviewed and updated No Known Allergies  Social History   Socioeconomic History   Marital status: Married    Spouse name: Not on file   Number of children: Not on file   Years of education: Not on file   Highest education level: Not on file  Occupational History   Not on file  Tobacco Use   Smoking status: Never  Smokeless tobacco: Current    Types: Snuff  Vaping Use   Vaping Use: Never used  Substance and Sexual Activity   Alcohol use: Yes   Drug use: No   Sexual activity: Not on file  Other Topics Concern   Not on file  Social History Narrative   From [place]. Conservative. Not excited about living in [place], but moved to be near children and grandchildren whom he loves to be with. Wife is extrovert while he prefers to be home or around small group  of people - misses a local bar.    Social Determinants of Health   Financial Resource Strain: Not on file  Food Insecurity: Not on file  Transportation Needs: Not on file  Physical Activity: Not on file  Stress: Not on file  Social Connections: Not on file        Objective:  Physical Exam: BP 121/72   Pulse 70   Temp 97.6 F (36.4 C) (Temporal)   Ht 5\' 10"  (1.778 m)   Wt 232 lb 9.6 oz (105.5 kg)   SpO2 97%   BMI 33.37 kg/m   Body mass index is 33.37 kg/m. Wt Readings from Last 3 Encounters:  09/23/21 232 lb 9.6 oz (105.5 kg)  10/16/19 227 lb (103 kg)  03/01/19 240 lb 3.2 oz (109 kg)   Gen: NAD, resting comfortably HEENT: TMs normal bilaterally. OP clear. No thyromegaly noted.  CV: RRR with no murmurs appreciated Pulm: NWOB, CTAB with no crackles, wheezes, or rhonchi GI: Normal bowel sounds present. Soft, Nontender, Nondistended. MSK: no edema, cyanosis, or clubbing noted Skin: warm, dry.  Hyperkeratotic lesion about 3 to 4 mm in diameter on left mid chest. Neuro: CN2-12 grossly intact. Strength 5/5 in upper and lower extremities. Reflexes symmetric and intact bilaterally.  Psych: Normal affect and thought content  Cryotherapy Procedure Note  Pre-operative Diagnosis: Actinic keratosis  Locations: Chest  Indications: Therapeutic  Procedure Details  Patient informed of risks (permanent scarring, infection, light or dark discoloration, bleeding, infection, weakness, numbness and recurrence of the lesion) and benefits of the procedure and verbal informed consent obtained.  The areas are treated with liquid nitrogen therapy, frozen until ice ball extended 2 mm beyond lesion, allowed to thaw, and treated again. The patient tolerated procedure well.  The patient was instructed on post-op care, warned that there may be blister formation, redness and pain. Recommend OTC analgesia as needed for pain.  Condition: Stable  Complications: none.      03/03/19. Katina Degree,  MD 09/23/2021 1:24 PM

## 2021-09-23 NOTE — Assessment & Plan Note (Signed)
Check lipids. Discussed lifestyle modifications.  

## 2021-09-23 NOTE — Patient Instructions (Signed)
It was very nice to see you today!  We froze a spot on your chest today.  This should scab up and fall off over the next 1 to 2 weeks.  We will check blood work today.  We will see back in year for your next physical.  Come back sooner if needed.  Take care, Dr Jimmey Ralph  PLEASE NOTE:  If you had any lab tests please let us know if you have not heard back within a few days. You may see your results on mychart before we have a chance to review them but we will give you a call once they are reviewed by [place]. If we ordered any referrals today, please let us know if you have not heard from their office within the next week.   Please try these tips to maintain a healthy lifestyle:  Eat at least 3 REAL meals and 1-2 snacks per day.  Aim for no more than 5 hours between eating.  If you eat breakfast, please do so within one hour of getting up.   Each meal should contain half fruits/vegetables, one quarter protein, and one quarter carbs (no bigger than a computer mouse)  Cut down on sweet beverages. This includes juice, soda, and sweet tea.   Drink at least 1 glass of water with each meal and aim for at least 8 glasses per day  Exercise at least 150 minutes every week.    Preventive Care 65 Years and Older, Male Preventive care refers to lifestyle choices and visits with your health care provider that can promote health and wellness. Preventive care visits are also called wellness exams. What can I expect for my preventive care visit? Counseling During your preventive care visit, your health care provider may ask about your: Medical history, including: Past medical problems. Family medical history. History of falls. Current health, including: Emotional well-being. Home life and relationship well-being. Sexual activity. Memory and ability to understand (cognition). Lifestyle, including: Alcohol, nicotine or tobacco, and drug use. Access to firearms. Diet, exercise, and sleep  habits. Work and work Astronomer. Sunscreen use. Safety issues such as seatbelt and bike helmet use. Physical exam Your health care provider will check your: Height and weight. These may be used to calculate your BMI (body mass index). BMI is a measurement that tells if you are at a healthy weight. Waist circumference. This measures the distance around your waistline. This measurement also tells if you are at a healthy weight and may help predict your risk of certain diseases, such as type 2 diabetes and high blood pressure. Heart rate and blood pressure. Body temperature. Skin for abnormal spots. What immunizations do I need?  Vaccines are usually given at various ages, according to a schedule. Your health care provider will recommend vaccines for you based on your age, medical history, and lifestyle or other factors, such as travel or where you work. What tests do I need? Screening Your health care provider may recommend screening tests for certain conditions. This may include: Lipid and cholesterol levels. Diabetes screening. This is done by checking your blood sugar (glucose) after you have not eaten for a while (fasting). Hepatitis C test. Hepatitis B test. HIV (human immunodeficiency virus) test. STI (sexually transmitted infection) testing, if you are at risk. Lung cancer screening. Colorectal cancer screening. Prostate cancer screening. Abdominal aortic aneurysm (AAA) screening. You may need this if you are a current or former smoker. Talk with your health care provider about your test results, treatment options,  and if necessary, the need for more tests. Follow these instructions at home: Eating and drinking  Eat a diet that includes fresh fruits and vegetables, whole grains, lean protein, and low-fat dairy products. Limit your intake of foods with high amounts of sugar, saturated fats, and salt. Take vitamin and mineral supplements as recommended by your health care  provider. Do not drink alcohol if your health care provider tells you not to drink. If you drink alcohol: Limit how much you have to 0-2 drinks a day. Know how much alcohol is in your drink. In the [place] one drink equals one 12 oz bottle of beer (355 mL), one 5 oz glass of wine (148 mL), or one 1 oz glass of hard liquor (44 mL). Lifestyle Brush your teeth every morning and night with fluoride toothpaste. Floss one time each day. Exercise for at least 30 minutes 5 or more days each week. Do not use any products that contain nicotine or tobacco. These products include cigarettes, chewing tobacco, and vaping devices, such as e-cigarettes. If you need help quitting, ask your health care provider. Do not use drugs. If you are sexually active, practice safe sex. Use a condom or other form of protection to prevent STIs. Take aspirin only as told by your health care provider. Make sure that you understand how much to take and what form to take. Work with your health care provider to find out whether it is safe and beneficial for you to take aspirin daily. Ask your health care provider if you need to take a cholesterol-lowering medicine (statin). Find healthy ways to manage stress, such as: Meditation, yoga, or listening to music. Journaling. Talking to a trusted person. Spending time with friends and family. Safety Always wear your seat belt while driving or riding in a vehicle. Do not drive: If you have been drinking alcohol. Do not ride with someone who has been drinking. When you are tired or distracted. While texting. If you have been using any mind-altering substances or drugs. Wear a helmet and other protective equipment during sports activities. If you have firearms in your house, make sure you follow all gun safety procedures. Minimize exposure to UV radiation to reduce your risk of skin cancer. What's next? Visit your health care provider once a year for an annual wellness visit. Ask  your health care provider how often you should have your eyes and teeth checked. Stay up to date on all vaccines. This information is not intended to replace advice given to you by your health care provider. Make sure you discuss any questions you have with your health care provider. Document Revised: 08/14/2020 Document Reviewed: 08/14/2020 Elsevier Patient Education  [place].

## 2021-09-26 NOTE — Progress Notes (Signed)
Please inform patient of the following:  Blood sugar very elevated into the diabetic range.  Recommend starting metformin 500 mg daily.  He should continue to work on diet and exercise.  I would like to see him back in 3 to 6 months to recheck his A1c.  Cholesterol levels are up a little bit as well.  He would benefit from starting a statin medication to improve his numbers and lower risk of heart attack and stroke.  Please send in Lipitor 40 mg daily if he is agreeable to start.  We should recheck this in a year.    Everything else is stable and can be rechecked in a year.

## 2021-09-29 ENCOUNTER — Other Ambulatory Visit: Payer: Self-pay | Admitting: *Deleted

## 2021-09-29 MED ORDER — METFORMIN HCL 500 MG PO TABS: 500.0000 mg | ORAL_TABLET | Freq: Two times a day (BID) | ORAL | 3 refills | Status: DC

## 2021-11-24 ENCOUNTER — Encounter: Payer: Self-pay | Admitting: *Deleted

## 2021-12-02 ENCOUNTER — Other Ambulatory Visit (HOSPITAL_BASED_OUTPATIENT_CLINIC_OR_DEPARTMENT_OTHER): Payer: Self-pay

## 2021-12-02 MED ORDER — AREXVY 120 MCG/0.5ML IM SUSR
INTRAMUSCULAR | 0 refills | Status: DC
  Filled 2021-12-02: qty 0.5, 1d supply, fill #0

## 2022-02-11 ENCOUNTER — Encounter: Payer: Self-pay | Admitting: Family Medicine

## 2022-02-12 ENCOUNTER — Encounter: Payer: Self-pay | Admitting: *Deleted

## 2022-02-12 ENCOUNTER — Ambulatory Visit: Payer: Medicare Other | Admitting: Family Medicine

## 2022-02-17 ENCOUNTER — Encounter: Payer: Self-pay | Admitting: Family Medicine

## 2022-02-17 ENCOUNTER — Ambulatory Visit (INDEPENDENT_AMBULATORY_CARE_PROVIDER_SITE_OTHER): Payer: Medicare Other | Admitting: Family Medicine

## 2022-02-17 VITALS — BP 120/77 | HR 58 | Temp 97.3°F | Ht 70.0 in | Wt 234.2 lb

## 2022-02-17 DIAGNOSIS — L570 Actinic keratosis: Secondary | ICD-10-CM

## 2022-02-17 DIAGNOSIS — R739 Hyperglycemia, unspecified: Secondary | ICD-10-CM | POA: Diagnosis not present

## 2022-02-17 DIAGNOSIS — E7800 Pure hypercholesterolemia, unspecified: Secondary | ICD-10-CM

## 2022-02-17 LAB — POCT GLYCOSYLATED HEMOGLOBIN (HGB A1C): Hemoglobin A1C: 6 % — AB (ref 4.0–5.6)

## 2022-02-17 MED ORDER — METFORMIN HCL 500 MG PO TABS: 500.0000 mg | ORAL_TABLET | Freq: Every day | ORAL | 3 refills | Status: AC

## 2022-02-17 NOTE — Patient Instructions (Signed)
It was very nice to see you today!  It is okay for you to go to 1 pill daily on your metformin.  Please keep up the good work with your diet and exercise.  We froze the spots in your scalp today.  Will see you back in July for your annual physical.  Come back sooner if needed.  Take care, Dr Jimmey Ralph  PLEASE NOTE:  If you had any lab tests, please let us know if you have not heard back within a few days. You may see your results on mychart before we have a chance to review them but we will give you a call once they are reviewed by [place].   If we ordered any referrals today, please let us know if you have not heard from their office within the next week.   If you had any urgent prescriptions sent in today, please check with the pharmacy within an hour of our visit to make sure the prescription was transmitted appropriately.   Please try these tips to maintain a healthy lifestyle:  Eat at least 3 REAL meals and 1-2 snacks per day.  Aim for no more than 5 hours between eating.  If you eat breakfast, please do so within one hour of getting up.   Each meal should contain half fruits/vegetables, one quarter protein, and one quarter carbs (no bigger than a computer mouse)  Cut down on sweet beverages. This includes juice, soda, and sweet tea.   Drink at least 1 glass of water with each meal and aim for at least 8 glasses per day  Exercise at least 150 minutes every week.

## 2022-02-17 NOTE — Progress Notes (Signed)
   Shaun Prince is a 76 y.o. male who presents today for an office visit.  Assessment/Plan:  Chronic Problems Addressed Today: Hyperglycemia A1c improved to 6.0.  He will continue with metformin.  It is okay to decrease to 500 mg once daily.  He will continue to work on diet and exercise.  We can recheck when he comes back in for CPE next summer.  Hyperlipidemia Declined statin.  Recheck lipids when he comes back in for next physical.  Actinic keratosis Cryotherapy applied today.  See below procedure note.  He tolerated well.     Subjective:  HPI:  See A/p for status of chronic conditions.   Patient here for follow up today. He was last seen here about 5 months for his annual physical. A1c was elevated to 6.5 at that time. He was also found to have high cholesterol.  We recommended he start a statin and metformin. He was agreeable to start metformin and has been taking 500mg  twice daily.  He has been doing reasonably well with this though does have occasional missed doses. No significant side effects.  He has a few lesions on his scalp that he would like to have frozen today.       Objective:  Physical Exam: BP 120/77   Pulse (!) 58   Temp (!) 97.3 F (36.3 C) (Temporal)   Ht 5\' 10"  (1.778 m)   Wt 234 lb 3.2 oz (106.2 kg)   SpO2 96%   BMI 33.60 kg/m   Wt Readings from Last 3 Encounters:  02/17/22 234 lb 3.2 oz (106.2 kg)  09/23/21 232 lb 9.6 oz (105.5 kg)  10/16/19 227 lb (103 kg)  Gen: No acute distress, resting comfortably CV: Regular rate and rhythm with no murmurs appreciated Pulm: Normal work of breathing, clear to auscultation bilaterally with no crackles, wheezes, or rhonchi Skin: Several scattered actinic keratoses on scalp Neuro: Grossly normal, moves all extremities Psych: Normal affect and thought content  Cryotherapy Procedure Note  Pre-operative Diagnosis: Actinic keratosis  Locations: Scalp  Indications: Therapeutic  Procedure Details  Patient  informed of risks (permanent scarring, infection, light or dark discoloration, bleeding, infection, weakness, numbness and recurrence of the lesion) and benefits of the procedure and verbal informed consent obtained.  The areas are treated with liquid nitrogen therapy, frozen until ice ball extended 3 mm beyond lesion, allowed to thaw, and treated again. The patient tolerated procedure well.  A total of 4 lesions were treated. The patient was instructed on post-op care, warned that there may be blister formation, redness and pain. Recommend OTC analgesia as needed for pain.  Condition: Stable  Complications: none.       09/25/21. 10/18/19, MD 02/17/2022 12:22 PM

## 2022-02-17 NOTE — Assessment & Plan Note (Signed)
A1c improved to 6.0.  He will continue with metformin.  It is okay to decrease to 500 mg once daily.  He will continue to work on diet and exercise.  We can recheck when he comes back in for CPE next summer.

## 2022-02-17 NOTE — Assessment & Plan Note (Signed)
Cryotherapy applied today.  See below procedure note.  He tolerated well. 

## 2022-02-17 NOTE — Assessment & Plan Note (Signed)
Declined statin.  Recheck lipids when he comes back in for next physical.

## 2022-06-10 ENCOUNTER — Telehealth: Payer: Self-pay | Admitting: Family Medicine

## 2022-06-10 NOTE — Telephone Encounter (Signed)
Copied from CRM [id number]. Topic: Medicare AWV >> Jun 10, 2022 10:47 AM Gwenith Spitz wrote: Reason for CRM: Called patient to schedule Medicare Annual Wellness Visit (AWV). Left message for patient to call back and schedule Medicare Annual Wellness Visit (AWV).  Last date of AWV: 02/21/2019  Please schedule an appointment at any time with Inetta Fermo, [facility]. Please schedule AWVS with Inetta Fermo, [facility].  If any questions, please contact me at [phone].  Thank you ,  [name] [facility] AWV TEAM Direct Dial [phone]

## 2022-06-15 NOTE — Telephone Encounter (Signed)
Caller states patient got a call to set up AWV. Requests callback for scheduling when available.

## 2022-06-16 ENCOUNTER — Telehealth: Payer: Self-pay | Admitting: Family Medicine

## 2022-06-16 NOTE — Telephone Encounter (Signed)
Contacted Shaun Prince to schedule their annual wellness visit. Appointment made for 06/29/2022.  [name] [facility] AWV TEAM Direct Dial [phone]

## 2022-06-29 ENCOUNTER — Ambulatory Visit (INDEPENDENT_AMBULATORY_CARE_PROVIDER_SITE_OTHER): Payer: Medicare Other

## 2022-06-29 VITALS — BP 136/76 | HR 93 | Temp 97.7°F | Wt 231.6 lb

## 2022-06-29 DIAGNOSIS — Z0000 Encounter for general adult medical examination without abnormal findings: Secondary | ICD-10-CM | POA: Diagnosis not present

## 2022-06-29 NOTE — Patient Instructions (Signed)
Shaun Prince , Thank you for taking time to come for your Medicare Wellness Visit. I appreciate your ongoing commitment to your health goals. Please review the following plan we discussed and let me know if I can assist you in the future.   These are the goals we discussed:  Goals      Patient Stated     Lose some weight Will try to get  More active You will investigate hiking groups in the area and start hiking         This is a list of the screening recommended for you and due dates:  Health Maintenance  Topic Date Due   Zoster (Shingles) Vaccine (1 of 2) Never done   COVID-19 Vaccine (6 - 2023-24 season) 10/31/2021   Flu Shot  10/01/2022   Medicare Annual Wellness Visit  06/29/2023   DTaP/Tdap/Td vaccine (3 - Td or Tdap) 12/23/2027   Pneumonia Vaccine  Completed   Hepatitis C Screening: USPSTF Recommendation to screen - Ages 80-79 yo.  Completed   HPV Vaccine  Aged Out   Colon Cancer Screening  Discontinued    Advanced directives: Please bring a copy of your health care power of attorney and living will to the office at your convenience.  Conditions/risks identified: increase exercise   Next appointment: Follow up in one year for your annual wellness visit.   Preventive Care 44 Years and Older, Male  Preventive care refers to lifestyle choices and visits with your health care provider that can promote health and wellness. What does preventive care include? A yearly physical exam. This is also called an annual well check. Dental exams once or twice a year. Routine eye exams. Ask your health care provider how often you should have your eyes checked. Personal lifestyle choices, including: Daily care of your teeth and gums. Regular physical activity. Eating a healthy diet. Avoiding tobacco and drug use. Limiting alcohol use. Practicing safe sex. Taking low doses of aspirin every day. Taking vitamin and mineral supplements as recommended by your health care  provider. What happens during an annual well check? The services and screenings done by your health care provider during your annual well check will depend on your age, overall health, lifestyle risk factors, and family history of disease. Counseling  Your health care provider may ask you questions about your: Alcohol use. Tobacco use. Drug use. Emotional well-being. Home and relationship well-being. Sexual activity. Eating habits. History of falls. Memory and ability to understand (cognition). Work and work Astronomer. Screening  You may have the following tests or measurements: Height, weight, and BMI. Blood pressure. Lipid and cholesterol levels. These may be checked every 5 years, or more frequently if you are over 56 years old. Skin check. Lung cancer screening. You may have this screening every year starting at age 54 if you have a 30-pack-year history of smoking and currently smoke or have quit within the past 15 years. Fecal occult blood test (FOBT) of the stool. You may have this test every year starting at age 89. Flexible sigmoidoscopy or colonoscopy. You may have a sigmoidoscopy every 5 years or a colonoscopy every 10 years starting at age 31. Prostate cancer screening. Recommendations will vary depending on your family history and other risks. Hepatitis C blood test. Hepatitis B blood test. Sexually transmitted disease (STD) testing. Diabetes screening. This is done by checking your blood sugar (glucose) after you have not eaten for a while (fasting). You may have this done every 1-3 years. Abdominal  aortic aneurysm (AAA) screening. You may need this if you are a current or former smoker. Osteoporosis. You may be screened starting at age 67 if you are at high risk. Talk with your health care provider about your test results, treatment options, and if necessary, the need for more tests. Vaccines  Your health care provider may recommend certain vaccines, such  as: Influenza vaccine. This is recommended every year. Tetanus, diphtheria, and acellular pertussis (Tdap, Td) vaccine. You may need a Td booster every 10 years. Zoster vaccine. You may need this after age 40. Pneumococcal 13-valent conjugate (PCV13) vaccine. One dose is recommended after age 53. Pneumococcal polysaccharide (PPSV23) vaccine. One dose is recommended after age 72. Talk to your health care provider about which screenings and vaccines you need and how often you need them. This information is not intended to replace advice given to you by your health care provider. Make sure you discuss any questions you have with your health care provider. Document Released: 03/15/2015 Document Revised: 11/06/2015 Document Reviewed: 12/18/2014 Elsevier Interactive Patient Education  2017 [place] Prevention in the Home Falls can cause injuries. They can happen to people of all ages. There are many things you can do to make your home safe and to help prevent falls. What can I do on the outside of my home? Regularly fix the edges of walkways and driveways and fix any cracks. Remove anything that might make you trip as you walk through a door, such as a raised step or threshold. Trim any bushes or trees on the path to your home. Use bright outdoor lighting. Clear any walking paths of anything that might make someone trip, such as rocks or tools. Regularly check to see if handrails are loose or broken. Make sure that both sides of any steps have handrails. Any raised decks and porches should have guardrails on the edges. Have any leaves, snow, or ice cleared regularly. Use sand or salt on walking paths during winter. Clean up any spills in your garage right away. This includes oil or grease spills. What can I do in the bathroom? Use night lights. Install grab bars by the toilet and in the tub and shower. Do not use towel bars as grab bars. Use non-skid mats or decals in the tub or  shower. If you need to sit down in the shower, use a plastic, non-slip stool. Keep the floor dry. Clean up any water that spills on the floor as soon as it happens. Remove soap buildup in the tub or shower regularly. Attach bath mats securely with double-sided non-slip rug tape. Do not have throw rugs and other things on the floor that can make you trip. What can I do in the bedroom? Use night lights. Make sure that you have a light by your bed that is easy to reach. Do not use any sheets or blankets that are too big for your bed. They should not hang down onto the floor. Have a firm chair that has side arms. You can use this for support while you get dressed. Do not have throw rugs and other things on the floor that can make you trip. What can I do in the kitchen? Clean up any spills right away. Avoid walking on wet floors. Keep items that you use a lot in easy-to-reach places. If you need to reach something above you, use a strong step stool that has a grab bar. Keep electrical cords out of the way. Do not use  floor polish or wax that makes floors slippery. If you must use wax, use non-skid floor wax. Do not have throw rugs and other things on the floor that can make you trip. What can I do with my stairs? Do not leave any items on the stairs. Make sure that there are handrails on both sides of the stairs and use them. Fix handrails that are broken or loose. Make sure that handrails are as long as the stairways. Check any carpeting to make sure that it is firmly attached to the stairs. Fix any carpet that is loose or worn. Avoid having throw rugs at the top or bottom of the stairs. If you do have throw rugs, attach them to the floor with carpet tape. Make sure that you have a light switch at the top of the stairs and the bottom of the stairs. If you do not have them, ask someone to add them for you. What else can I do to help prevent falls? Wear shoes that: Do not have high heels. Have  rubber bottoms. Are comfortable and fit you well. Are closed at the toe. Do not wear sandals. If you use a stepladder: Make sure that it is fully opened. Do not climb a closed stepladder. Make sure that both sides of the stepladder are locked into place. Ask someone to hold it for you, if possible. Clearly mark and make sure that you can see: Any grab bars or handrails. First and last steps. Where the edge of each step is. Use tools that help you move around (mobility aids) if they are needed. These include: Canes. Walkers. Scooters. Crutches. Turn on the lights when you go into a dark area. Replace any light bulbs as soon as they burn out. Set up your furniture so you have a clear path. Avoid moving your furniture around. If any of your floors are uneven, fix them. If there are any pets around you, be aware of where they are. Review your medicines with your doctor. Some medicines can make you feel dizzy. This can increase your chance of falling. Ask your doctor what other things that you can do to help prevent falls. This information is not intended to replace advice given to you by your health care provider. Make sure you discuss any questions you have with your health care provider. Document Released: 12/13/2008 Document Revised: 07/25/2015 Document Reviewed: 03/23/2014 Elsevier Interactive Patient Education  2017 [employer].

## 2022-06-29 NOTE — Progress Notes (Signed)
Subjective:   Shaun Prince is a 77 y.o. male who presents for Medicare Annual/Subsequent preventive examination.  Review of Systems     Cardiac Risk Factors include: advanced age (>91men, >45 women);obesity (BMI >30kg/m2);dyslipidemia     Objective:    Today's Vitals   06/29/22 1510  BP: 136/76  Pulse: 93  Temp: 97.7 F (36.5 C)  Weight: 231 lb 9.6 oz (105.1 kg)   Body mass index is 33.23 kg/m.     06/29/2022    3:26 PM 07/18/2018    5:06 AM 09/16/2017    5:29 PM  Advanced Directives  Does Patient Have a Medical Advance Directive? Yes No No  Type of Estate agent of [employer];Living will    Copy of Healthcare Power of Attorney in Chart? No - copy requested    Would patient like information on creating a medical advance directive?  No - Patient declined     Current Medications (verified) Outpatient Encounter Medications as of 06/29/2022  Medication Sig   FISH OIL-KRILL OIL PO Take by mouth.   Multiple Vitamins-Minerals (CENTRUM ADULTS) TABS Take by mouth.   Nutritional Supplements (VITAMIN D BOOSTER PO) Take by mouth.   metFORMIN (GLUCOPHAGE) 500 MG tablet Take 1 tablet (500 mg total) by mouth daily with breakfast. (Patient not taking: Reported on 06/29/2022)   RSV vaccine recomb adjuvanted (AREXVY) 120 MCG/0.5ML injection Inject into the muscle.   No facility-administered encounter medications on file as of 06/29/2022.    Allergies (verified) Patient has no known allergies.   History: Past Medical History:  Diagnosis Date   Hyperlipidemia    Tremor 09/28/2016   Past Surgical History:  Procedure Laterality Date   ADENOIDECTOMY     BACK SURGERY N/A    SHOULDER SURGERY Left    TONSILLECTOMY     Family History  Problem Relation Age of Onset   Diabetes Mother    Heart attack Father    Coronary artery disease Father    Skin cancer Father    Diabetes Maternal Grandmother    Heart attack Paternal Grandfather    Social History    Socioeconomic History   Marital status: Married    Spouse name: Not on file   Number of children: Not on file   Years of education: Not on file   Highest education level: Not on file  Occupational History   Not on file  Tobacco Use   Smoking status: Never   Smokeless tobacco: Current    Types: Snuff  Vaping Use   Vaping Use: Never used  Substance and Sexual Activity   Alcohol use: Yes   Drug use: No   Sexual activity: Not on file  Other Topics Concern   Not on file  Social History Narrative   From [place]. Conservative. Not excited about living in [place], but moved to be near children and grandchildren whom he loves to be with. Wife is extrovert while he prefers to be home or around small group of people - misses a local bar.    Social Determinants of Health   Financial Resource Strain: Low Risk  (06/29/2022)   Overall Financial Resource Strain (CARDIA)    Difficulty of Paying Living Expenses: Not hard at all  Food Insecurity: No Food Insecurity (06/29/2022)   Hunger Vital Sign    Worried About Running Out of Food in the Last Year: Never true    Ran Out of Food in the Last Year: Never true  Transportation Needs: No Transportation Needs (  06/29/2022)   PRAPARE - Administrator, Civil Service (Medical): No    Lack of Transportation (Non-Medical): No  Physical Activity: Inactive (06/29/2022)   Exercise Vital Sign    Days of Exercise per Week: 0 days    Minutes of Exercise per Session: 0 min  Stress: No Stress Concern Present (06/29/2022)   [employer] of Occupational Health - Occupational Stress Questionnaire    Feeling of Stress : Not at all  Social Connections: Moderately Isolated (06/29/2022)   Social Connection and Isolation Panel [NHANES]    Frequency of Communication with Friends and Family: Three times a week    Frequency of Social Gatherings with Friends and Family: More than three times a week    Attends Religious Services: Never    Database administrator  or Organizations: No    Attends Engineer, structural: Never    Marital Status: Married    Tobacco Counseling Ready to quit: Not Answered Counseling given: Not Answered   Clinical Intake:  Pre-visit preparation completed: Yes  Pain : No/denies pain     BMI - recorded: 33.23 Nutritional Status: BMI > 30  Obese Nutritional Risks: None Diabetes: No  How often do you need to have someone help you when you read instructions, pamphlets, or other written materials from your doctor or pharmacy?: 1 - Never  Diabetic?no  Interpreter Needed?: No  Information entered by :: Lanier Ensign, LPN   Activities of Daily Living    06/29/2022    3:28 PM  In your present state of health, do you have any difficulty performing the following activities:  Hearing? 0  Vision? 0  Difficulty concentrating or making decisions? 0  Walking or climbing stairs? 0  Dressing or bathing? 0  Doing errands, shopping? 0  Preparing Food and eating ? N  Using the Toilet? N  In the past six months, have you accidently leaked urine? N  Do you have problems with loss of bowel control? N  Managing your Medications? N  Managing your Finances? N  Housekeeping or managing your Housekeeping? N    Patient Care Team: Ardith Dark, MD as PCP - General (Family Medicine)  Indicate any recent Medical Services you may have received from other than [facility] providers in the past year (date may be approximate).     Assessment:   This is a routine wellness examination for [name].  Hearing/Vision screen Hearing Screening - Comments:: Pt denies any hearing issues  Vision Screening - Comments:: Encouraged to follow up with provider for annual eye exams   Dietary issues and exercise activities discussed: Current Exercise Habits: The patient does not participate in regular exercise at present   Goals Addressed             This Visit's Progress    Patient Stated       Get more active         Depression Screen    06/29/2022    3:23 PM 02/17/2022   11:35 AM 09/23/2021    1:00 PM 02/21/2019    1:49 PM 09/28/2016   11:15 AM  PHQ 2/9 Scores  PHQ - 2 Score 0 0 0 0 0    Fall Risk    06/29/2022    3:27 PM 02/17/2022   11:35 AM 09/23/2021    1:00 PM 02/21/2019    1:34 PM 09/16/2017    5:31 PM  Fall Risk   Falls in the past year? 0 0  0 No  Number falls in past yr: 0 0 0    Injury with Fall? 0 0 0    Risk for fall due to : Impaired vision;Impaired mobility No Fall Risks No Fall Risks    Risk for fall due to: Comment bad ankle      Follow up Falls prevention discussed        FALL RISK PREVENTION PERTAINING TO THE HOME:  Any stairs in or around the home? Yes  If so, are there any without handrails? No  Home free of loose throw rugs in walkways, pet beds, electrical cords, etc? Yes  Adequate lighting in your home to reduce risk of falls? Yes   ASSISTIVE DEVICES UTILIZED TO PREVENT FALLS:  Life alert? No  Use of a cane, walker or w/c? No  Grab bars in the bathroom? No  Shower chair or bench in shower? No  Elevated toilet seat or a handicapped toilet? No   TIMED UP AND GO:  Was the test performed? Yes .  Length of time to ambulate 10 feet: 10 sec.   Gait steady and fast without use of assistive device  Cognitive Function:        06/29/2022    3:29 PM  6CIT Screen  What Year? 0 points  What month? 0 points  What time? 0 points  Count back from 20 0 points  Months in reverse 0 points  Repeat phrase 0 points  Total Score 0 points    Immunizations Immunization History  Administered Date(s) Administered   Fluad Quad(high Dose 65+) 11/25/2018   Influenza-Unspecified 01/06/2017, 12/28/2017   PFIZER(Purple Top)SARS-COV-2 Vaccination 04/10/2019, 05/05/2019, 12/06/2019, 06/05/2020   Pfizer Covid-19 Vaccine Bivalent Booster 69yrs & up 01/06/2021   Pneumococcal Conjugate-13 01/13/2016, 12/28/2017   Pneumococcal Polysaccharide-23 10/23/2011, 09/21/2020    Respiratory Syncytial Virus Vaccine,Recomb Aduvanted(Arexvy) 12/02/2021   Tdap 01/14/2016, 12/22/2017    TDAP status: Up to date  Flu Vaccine status: Declined, Education has been provided regarding the importance of this vaccine but patient still declined. Advised may receive this vaccine at local pharmacy or Health Dept. Aware to provide a copy of the vaccination record if obtained from local pharmacy or Health Dept. Verbalized acceptance and understanding.  Pneumococcal vaccine status: Up to date  Covid-19 vaccine status: Completed vaccines  Qualifies for Shingles Vaccine? Yes   Zostavax completed No   Shingrix Completed?: No.    Education has been provided regarding the importance of this vaccine. Patient has been advised to call insurance company to determine out of pocket expense if they have not yet received this vaccine. Advised may also receive vaccine at local pharmacy or Health Dept. Verbalized acceptance and understanding.  Screening Tests Health Maintenance  Topic Date Due   Zoster Vaccines- Shingrix (1 of 2) Never done   COVID-19 Vaccine (6 - 2023-24 season) 10/31/2021   INFLUENZA VACCINE  10/01/2022   Medicare Annual Wellness (AWV)  06/29/2023   DTaP/Tdap/Td (3 - Td or Tdap) 12/23/2027   Pneumonia Vaccine 33+ Years old  Completed   Hepatitis C Screening  Completed   HPV VACCINES  Aged Out   COLONOSCOPY (Pts 45-32yrs Insurance coverage will need to be confirmed)  Discontinued    Health Maintenance  Health Maintenance Due  Topic Date Due   Zoster Vaccines- Shingrix (1 of 2) Never done   COVID-19 Vaccine (6 - 2023-24 season) 10/31/2021    Colorectal cancer screening: No longer required.   Additional Screening:  Hepatitis C Screening:  Completed 01/19/18  Vision Screening: Recommended annual ophthalmology exams for early detection of glaucoma and other disorders of the eye. Is the patient up to date with their annual eye exam?  No  Who is the provider or what  is the name of the office in which the patient attends annual eye exams? Encouraged to follow up  If pt is not established with a provider, would they like to be referred to a provider to establish care? No .   Dental Screening: Recommended annual dental exams for proper oral hygiene  Community Resource Referral / Chronic Care Management: CRR required this visit?  No   CCM required this visit?  No      Plan:     I have personally reviewed and noted the following in the patient's chart:   Medical and social history Use of alcohol, tobacco or illicit drugs  Current medications and supplements including opioid prescriptions. Patient is not currently taking opioid prescriptions. Functional ability and status Nutritional status Physical activity Advanced directives List of other physicians Hospitalizations, surgeries, and ER visits in previous 12 months Vitals Screenings to include cognitive, depression, and falls Referrals and appointments  In addition, I have reviewed and discussed with patient certain preventive protocols, quality metrics, and best practice recommendations. A written personalized care plan for preventive services as well as general preventive health recommendations were provided to patient.     Marzella Schlein, LPN   1/61/0960   Nurse Notes: none

## 2022-09-25 ENCOUNTER — Encounter: Payer: Medicare Other | Admitting: Family Medicine

## 2022-09-30 ENCOUNTER — Encounter (INDEPENDENT_AMBULATORY_CARE_PROVIDER_SITE_OTHER): Payer: Self-pay

## 2022-10-20 ENCOUNTER — Encounter: Payer: Self-pay | Admitting: Family Medicine

## 2022-10-20 ENCOUNTER — Ambulatory Visit (INDEPENDENT_AMBULATORY_CARE_PROVIDER_SITE_OTHER): Payer: Medicare Other | Admitting: Family Medicine

## 2022-10-20 VITALS — BP 129/76 | HR 63 | Temp 97.1°F | Ht 70.0 in | Wt 232.2 lb

## 2022-10-20 DIAGNOSIS — E7800 Pure hypercholesterolemia, unspecified: Secondary | ICD-10-CM | POA: Diagnosis not present

## 2022-10-20 DIAGNOSIS — Z0000 Encounter for general adult medical examination without abnormal findings: Secondary | ICD-10-CM | POA: Diagnosis not present

## 2022-10-20 DIAGNOSIS — R739 Hyperglycemia, unspecified: Secondary | ICD-10-CM | POA: Diagnosis not present

## 2022-10-20 DIAGNOSIS — Z125 Encounter for screening for malignant neoplasm of prostate: Secondary | ICD-10-CM | POA: Diagnosis not present

## 2022-10-20 LAB — LIPID PANEL
Cholesterol: 246 mg/dL — ABNORMAL HIGH (ref 0–200)
HDL: 30.3 mg/dL — ABNORMAL LOW (ref >39.00)
NonHDL: 215.33
Total CHOL/HDL Ratio: 8
Triglycerides: 284 mg/dL — ABNORMAL HIGH (ref 0.0–149.0)
VLDL: 56.8 mg/dL — ABNORMAL HIGH (ref 0.0–40.0)

## 2022-10-20 LAB — CBC
HCT: 46.6 % (ref 39.0–52.0)
Hemoglobin: 15.5 g/dL (ref 13.0–17.0)
MCHC: 33.2 g/dL (ref 30.0–36.0)
MCV: 92 fl (ref 78.0–100.0)
Platelets: 272 10*3/uL (ref 150.0–400.0)
RBC: 5.07 Mil/uL (ref 4.22–5.81)
RDW: 13.2 % (ref 11.5–15.5)
WBC: 5.8 10*3/uL (ref 4.0–10.5)

## 2022-10-20 LAB — PSA: PSA: 4.86 ng/mL — ABNORMAL HIGH (ref 0.10–4.00)

## 2022-10-20 LAB — COMPREHENSIVE METABOLIC PANEL
ALT: 28 U/L (ref 0–53)
AST: 18 U/L (ref 0–37)
Albumin: 4.4 g/dL (ref 3.5–5.2)
Alkaline Phosphatase: 46 U/L (ref 39–117)
BUN: 19 mg/dL (ref 6–23)
CO2: 27 mEq/L (ref 19–32)
Calcium: 9.3 mg/dL (ref 8.4–10.5)
Chloride: 104 mEq/L (ref 96–112)
Creatinine, Ser: 1.34 mg/dL (ref 0.40–1.50)
GFR: 51.51 mL/min — ABNORMAL LOW (ref >60.00)
Glucose, Bld: 149 mg/dL — ABNORMAL HIGH (ref 70–99)
Potassium: 4.4 mEq/L (ref 3.5–5.1)
Sodium: 138 mEq/L (ref 135–145)
Total Bilirubin: 0.5 mg/dL (ref 0.2–1.2)
Total Protein: 6.8 g/dL (ref 6.0–8.3)

## 2022-10-20 LAB — HEMOGLOBIN A1C: Hgb A1c MFr Bld: 6.5 % (ref 4.6–6.5)

## 2022-10-20 LAB — LDL CHOLESTEROL, DIRECT: Direct LDL: 188 mg/dL

## 2022-10-20 LAB — TSH: TSH: 3.11 u[IU]/mL (ref 0.35–5.50)

## 2022-10-20 NOTE — Assessment & Plan Note (Signed)
Check lipids.  Not interested in statin.

## 2022-10-20 NOTE — Assessment & Plan Note (Signed)
Check A1c.  No longer on metformin due to running out of prescription.  Depending on A1c will need to restart.

## 2022-10-20 NOTE — Progress Notes (Addendum)
Chief Complaint:  Kingstan Ventura is a 77 y.o. male who presents today for his annual comprehensive physical exam.    Assessment/Plan:  Chronic Problems Addressed Today: Hyperlipidemia Check lipids.  Not interested in statin.  Hyperglycemia Check A1c.  No longer on metformin due to running out of prescription.  Depending on A1c will need to restart.  Preventative Healthcare: Check labs.  Up-to-date on screenings.  ADDENDUM: His TSH was incorrectly ordered under preventative health care.  This was intended to be ordered under his hyperglycemia and hyperlipidemia diagnoses.  Patient Counseling(The following topics were reviewed and/or handout was given):  -Nutrition: Stressed importance of moderation in sodium/caffeine intake, saturated fat and cholesterol, caloric balance, sufficient intake of fresh fruits, vegetables, and fiber.  -Stressed the importance of regular exercise.   -Substance Abuse: Discussed cessation/primary prevention of tobacco, alcohol, or other drug use; driving or other dangerous activities under the influence; availability of treatment for abuse.   -Injury prevention: Discussed safety belts, safety helmets, smoke detector, smoking near bedding or upholstery.   -Sexuality: Discussed sexually transmitted diseases, partner selection, use of condoms, avoidance of unintended pregnancy and contraceptive alternatives.   -Dental health: Discussed importance of regular tooth brushing, flossing, and dental visits.  -Health maintenance and immunizations reviewed. Please refer to Health maintenance section.  Return to care in 1 year for next preventative visit.     Subjective:  HPI:  He has no acute complaints today.  Last seen here about 9 months ago.  He stopped taking metformin.  Lifestyle Diet: None specific.  Exercise: None specific.      10/20/2022    1:29 PM  Depression screen PHQ 2/9  Decreased Interest 0  Down, Depressed, Hopeless 0  PHQ - 2 Score 0     ROS: Per HPI, otherwise a complete review of systems was negative.   PMH:  The following were reviewed and entered/updated in epic: Past Medical History:  Diagnosis Date   Hyperlipidemia    Tremor 09/28/2016   Patient Active Problem List   Diagnosis Date Noted   Hyperglycemia 02/17/2022   Actinic keratosis 09/23/2021   Obstructive sleep apnea 09/23/2017   Hyperlipidemia 09/28/2016   Past Surgical History:  Procedure Laterality Date   ADENOIDECTOMY     BACK SURGERY N/A    SHOULDER SURGERY Left    TONSILLECTOMY      Family History  Problem Relation Age of Onset   Diabetes Mother    Heart attack Father    Coronary artery disease Father    Skin cancer Father    Diabetes Maternal Grandmother    Heart attack Paternal Grandfather     Medications- reviewed and updated Current Outpatient Medications  Medication Sig Dispense Refill   Multiple Vitamins-Minerals (CENTRUM ADULTS) TABS Take by mouth.     Nutritional Supplements (VITAMIN D BOOSTER PO) Take by mouth.     metFORMIN (GLUCOPHAGE) 500 MG tablet Take 1 tablet (500 mg total) by mouth daily with breakfast. (Patient not taking: Reported on 06/29/2022) 90 tablet 3   simvastatin (ZOCOR) 20 MG tablet Take 1 tablet (20 mg total) by mouth at bedtime. 90 tablet 3   No current facility-administered medications for this visit.    Allergies-reviewed and updated No Known Allergies  Social History   Socioeconomic History   Marital status: Married    Spouse name: Not on file   Number of children: Not on file   Years of education: Not on file   Highest education level: Not on file  Occupational History   Not on file  Tobacco Use   Smoking status: Never   Smokeless tobacco: Current    Types: Snuff  Vaping Use   Vaping status: Never Used  Substance and Sexual Activity   Alcohol use: Yes   Drug use: No   Sexual activity: Not on file  Other Topics Concern   Not on file  Social History Narrative   From [place].  Conservative. Not excited about living in [place], but moved to be near children and grandchildren whom he loves to be with. Wife is extrovert while he prefers to be home or around small group of people - misses a local bar.    Social Drivers of Corporate investment banker Strain: Low Risk  (06/29/2022)   Overall Financial Resource Strain (CARDIA)    Difficulty of Paying Living Expenses: Not hard at all  Food Insecurity: No Food Insecurity (06/29/2022)   Hunger Vital Sign    Worried About Running Out of Food in the Last Year: Never true    Ran Out of Food in the Last Year: Never true  Transportation Needs: No Transportation Needs (06/29/2022)   PRAPARE - Administrator, Civil Service (Medical): No    Lack of Transportation (Non-Medical): No  Physical Activity: Inactive (06/29/2022)   Exercise Vital Sign    Days of Exercise per Week: 0 days    Minutes of Exercise per Session: 0 min  Stress: No Stress Concern Present (06/29/2022)   [employer] of Occupational Health - Occupational Stress Questionnaire    Feeling of Stress : Not at all  Social Connections: Moderately Isolated (06/29/2022)   Social Connection and Isolation Panel [NHANES]    Frequency of Communication with Friends and Family: Three times a week    Frequency of Social Gatherings with Friends and Family: More than three times a week    Attends Religious Services: Never    Database administrator or Organizations: No    Attends Engineer, structural: Never    Marital Status: Married        Objective:  Physical Exam: BP 129/76   Pulse 63   Temp (!) 97.1 F (36.2 C)   Ht 5\' 10"  (1.778 m)   Wt 232 lb 3.2 oz (105.3 kg)   SpO2 96%   BMI 33.32 kg/m   Body mass index is 33.32 kg/m. Wt Readings from Last 3 Encounters:  10/20/22 232 lb 3.2 oz (105.3 kg)  06/29/22 231 lb 9.6 oz (105.1 kg)  02/17/22 234 lb 3.2 oz (106.2 kg)   Gen: NAD, resting comfortably HEENT: TMs normal bilaterally. OP clear. No  thyromegaly noted.  CV: RRR with no murmurs appreciated Pulm: NWOB, CTAB with no crackles, wheezes, or rhonchi GI: Normal bowel sounds present. Soft, Nontender, Nondistended. MSK: no edema, cyanosis, or clubbing noted Skin: warm, dry Neuro: CN2-12 grossly intact. Strength 5/5 in upper and lower extremities. Reflexes symmetric and intact bilaterally.  Psych: Normal affect and thought content     Prayan Ulin M. Jimmey Ralph, MD 02/15/2023 3:54 PM

## 2022-10-20 NOTE — Patient Instructions (Addendum)
It was very nice to see you today!  We will check blood work today.   Please continue to work on diet and exercise.   Return in about 1 year (around 10/20/2023) for Annual Physical.   Take care, Dr Jimmey Ralph  PLEASE NOTE:  If you had any lab tests, please let us know if you have not heard back within a few days. You may see your results on mychart before we have a chance to review them but we will give you a call once they are reviewed by [place].   If we ordered any referrals today, please let us know if you have not heard from their office within the next week.   If you had any urgent prescriptions sent in today, please check with the pharmacy within an hour of our visit to make sure the prescription was transmitted appropriately.   Please try these tips to maintain a healthy lifestyle:  Eat at least 3 REAL meals and 1-2 snacks per day.  Aim for no more than 5 hours between eating.  If you eat breakfast, please do so within one hour of getting up.   Each meal should contain half fruits/vegetables, one quarter protein, and one quarter carbs (no bigger than a computer mouse)  Cut down on sweet beverages. This includes juice, soda, and sweet tea.   Drink at least 1 glass of water with each meal and aim for at least 8 glasses per day  Exercise at least 150 minutes every week.    Preventive Care 81 Years and Older, Male Preventive care refers to lifestyle choices and visits with your health care provider that can promote health and wellness. Preventive care visits are also called wellness exams. What can I expect for my preventive care visit? Counseling During your preventive care visit, your health care provider may ask about your: Medical history, including: Past medical problems. Family medical history. History of falls. Current health, including: Emotional well-being. Home life and relationship well-being. Sexual activity. Memory and ability to understand (cognition). Lifestyle,  including: Alcohol, nicotine or tobacco, and drug use. Access to firearms. Diet, exercise, and sleep habits. Work and work Astronomer. Sunscreen use. Safety issues such as seatbelt and bike helmet use. Physical exam Your health care provider will check your: Height and weight. These may be used to calculate your BMI (body mass index). BMI is a measurement that tells if you are at a healthy weight. Waist circumference. This measures the distance around your waistline. This measurement also tells if you are at a healthy weight and may help predict your risk of certain diseases, such as type 2 diabetes and high blood pressure. Heart rate and blood pressure. Body temperature. Skin for abnormal spots. What immunizations do I need?  Vaccines are usually given at various ages, according to a schedule. Your health care provider will recommend vaccines for you based on your age, medical history, and lifestyle or other factors, such as travel or where you work. What tests do I need? Screening Your health care provider may recommend screening tests for certain conditions. This may include: Lipid and cholesterol levels. Diabetes screening. This is done by checking your blood sugar (glucose) after you have not eaten for a while (fasting). Hepatitis C test. Hepatitis B test. HIV (human immunodeficiency virus) test. STI (sexually transmitted infection) testing, if you are at risk. Lung cancer screening. Colorectal cancer screening. Prostate cancer screening. Abdominal aortic aneurysm (AAA) screening. You may need this if you are a current or  former smoker. Talk with your health care provider about your test results, treatment options, and if necessary, the need for more tests. Follow these instructions at home: Eating and drinking  Eat a diet that includes fresh fruits and vegetables, whole grains, lean protein, and low-fat dairy products. Limit your intake of foods with high amounts of sugar,  saturated fats, and salt. Take vitamin and mineral supplements as recommended by your health care provider. Do not drink alcohol if your health care provider tells you not to drink. If you drink alcohol: Limit how much you have to 0-2 drinks a day. Know how much alcohol is in your drink. In the [place] one drink equals one 12 oz bottle of beer (355 mL), one 5 oz glass of wine (148 mL), or one 1 oz glass of hard liquor (44 mL). Lifestyle Brush your teeth every morning and night with fluoride toothpaste. Floss one time each day. Exercise for at least 30 minutes 5 or more days each week. Do not use any products that contain nicotine or tobacco. These products include cigarettes, chewing tobacco, and vaping devices, such as e-cigarettes. If you need help quitting, ask your health care provider. Do not use drugs. If you are sexually active, practice safe sex. Use a condom or other form of protection to prevent STIs. Take aspirin only as told by your health care provider. Make sure that you understand how much to take and what form to take. Work with your health care provider to find out whether it is safe and beneficial for you to take aspirin daily. Ask your health care provider if you need to take a cholesterol-lowering medicine (statin). Find healthy ways to manage stress, such as: Meditation, yoga, or listening to music. Journaling. Talking to a trusted person. Spending time with friends and family. Safety Always wear your seat belt while driving or riding in a vehicle. Do not drive: If you have been drinking alcohol. Do not ride with someone who has been drinking. When you are tired or distracted. While texting. If you have been using any mind-altering substances or drugs. Wear a helmet and other protective equipment during sports activities. If you have firearms in your house, make sure you follow all gun safety procedures. Minimize exposure to UV radiation to reduce your risk of skin  cancer. What's next? Visit your health care provider once a year for an annual wellness visit. Ask your health care provider how often you should have your eyes and teeth checked. Stay up to date on all vaccines. This information is not intended to replace advice given to you by your health care provider. Make sure you discuss any questions you have with your health care provider. Document Revised: 08/14/2020 Document Reviewed: 08/14/2020 Elsevier Patient Education  2024 [employer].

## 2022-10-21 ENCOUNTER — Other Ambulatory Visit: Payer: Self-pay | Admitting: *Deleted

## 2022-10-21 DIAGNOSIS — R739 Hyperglycemia, unspecified: Secondary | ICD-10-CM

## 2022-10-21 DIAGNOSIS — E785 Hyperlipidemia, unspecified: Secondary | ICD-10-CM

## 2022-10-21 DIAGNOSIS — R972 Elevated prostate specific antigen [PSA]: Secondary | ICD-10-CM

## 2022-10-21 NOTE — Progress Notes (Signed)
His PSA is mildly elevated.  This is probably age-related but we should recheck in 3 to 6 months.  Please place future order.  Cholesterol is elevated.  I know he has declined statins in the past however there are alternatives or low intensity statins we can try to lower this number and improve risk of heart attack and stroke.  Please send in simvastatin 20 mg daily if he wishes to try another statin or Zetia 10 mg daily if he wishes to try an alternative.  We should also recheck this in 3 to 6 months.  A1c is still borderline.  It is okay for him to stay off the metformin for now and we can recheck in 3-6 months.   All of his other labs are stable and we can recheck in a year.

## 2022-10-22 ENCOUNTER — Other Ambulatory Visit: Payer: Self-pay

## 2022-10-22 DIAGNOSIS — R972 Elevated prostate specific antigen [PSA]: Secondary | ICD-10-CM

## 2022-10-25 ENCOUNTER — Encounter: Payer: Self-pay | Admitting: Family Medicine

## 2022-10-26 ENCOUNTER — Other Ambulatory Visit: Payer: Self-pay

## 2022-10-26 MED ORDER — SIMVASTATIN 20 MG PO TABS: 20.0000 mg | ORAL_TABLET | Freq: Every day | ORAL | 3 refills | Status: AC

## 2022-10-29 ENCOUNTER — Other Ambulatory Visit (HOSPITAL_BASED_OUTPATIENT_CLINIC_OR_DEPARTMENT_OTHER): Payer: Self-pay

## 2023-01-09 ENCOUNTER — Encounter: Payer: Self-pay | Admitting: Family Medicine

## 2023-01-12 NOTE — Telephone Encounter (Signed)
Will forward to tammy.  Katina Degree. Jimmey Ralph, MD 01/12/2023 8:24 AM

## 2023-01-14 NOTE — Telephone Encounter (Signed)
Left patient vm to call me back in regard. 

## 2023-01-15 NOTE — Telephone Encounter (Signed)
I have spoken with patient in regard.

## 2023-02-18 NOTE — Telephone Encounter (Signed)
Coding has been changed and resubmitted. I have left pt vm in regard.

## 2023-02-26 ENCOUNTER — Other Ambulatory Visit (HOSPITAL_BASED_OUTPATIENT_CLINIC_OR_DEPARTMENT_OTHER): Payer: Self-pay

## 2023-07-29 ENCOUNTER — Other Ambulatory Visit (HOSPITAL_BASED_OUTPATIENT_CLINIC_OR_DEPARTMENT_OTHER): Payer: Self-pay

## 2023-07-29 MED ORDER — AMOXICILLIN 500 MG PO CAPS
500.0000 mg | ORAL_CAPSULE | Freq: Three times a day (TID) | ORAL | 1 refills | Status: DC
  Filled 2023-07-29: qty 21, 7d supply, fill #0

## 2023-09-13 ENCOUNTER — Ambulatory Visit (INDEPENDENT_AMBULATORY_CARE_PROVIDER_SITE_OTHER)

## 2023-09-13 VITALS — BP 120/68 | HR 81 | Temp 98.2°F | Ht 71.0 in | Wt 234.8 lb

## 2023-09-13 DIAGNOSIS — Z0000 Encounter for general adult medical examination without abnormal findings: Secondary | ICD-10-CM | POA: Diagnosis not present

## 2023-09-13 NOTE — Progress Notes (Addendum)
 Subjective:   Shaun Prince is a 78 y.o. who presents for a Medicare Wellness preventive visit.  As a reminder, Annual Wellness Visits don't include a physical exam, and some assessments may be limited, especially if this visit is performed virtually. We may recommend an in-person follow-up visit with your provider if needed.  Visit Complete: In person    Persons Participating in Visit: Patient.  AWV Questionnaire: No: Patient Medicare AWV questionnaire was not completed prior to this visit.  Cardiac Risk Factors include: advanced age (>39men, >89 women);dyslipidemia;male gender;obesity (BMI >30kg/m2);smoking/ tobacco exposure     Objective:    Today's Vitals   09/13/23 1121  BP: 120/68  Pulse: 81  Temp: 98.2 F (36.8 C)  SpO2: 95%  Weight: 234 lb 12.8 oz (106.5 kg)  Height: 5' 11 (1.803 m)   Body mass index is 32.75 kg/m.     09/13/2023   11:36 AM 06/29/2022    3:26 PM 07/18/2018    5:06 AM 09/16/2017    5:29 PM  Advanced Directives  Does Patient Have a Medical Advance Directive? Yes Yes No No   Type of Estate agent of [place];Living will Healthcare Power of [place];Living will    Copy of Healthcare Power of Attorney in Chart? No - copy requested No - copy requested    Would patient like information on creating a medical advance directive?   No - Patient declined       Data saved with a previous flowsheet row definition    Current Medications (verified) Outpatient Encounter Medications as of 09/13/2023  Medication Sig   Multiple Vitamins-Minerals (CENTRUM ADULTS) TABS Take by mouth.   Nutritional Supplements (VITAMIN D BOOSTER PO) Take by mouth.   metFORMIN  (GLUCOPHAGE ) 500 MG tablet Take 1 tablet (500 mg total) by mouth daily with breakfast. (Patient not taking: Reported on 09/13/2023)   simvastatin  (ZOCOR ) 20 MG tablet Take 1 tablet (20 mg total) by mouth at bedtime. (Patient not taking: Reported on 09/13/2023)   [DISCONTINUED]  amoxicillin  (AMOXIL ) 500 MG capsule Take 1 capsule (500 mg total) by mouth 3 (three) times daily until gone.   No facility-administered encounter medications on file as of 09/13/2023.    Allergies (verified) Patient has no known allergies.   History: Past Medical History:  Diagnosis Date   Hyperlipidemia    Tremor 09/28/2016   Past Surgical History:  Procedure Laterality Date   ADENOIDECTOMY     BACK SURGERY N/A    SHOULDER SURGERY Left    TONSILLECTOMY     Family History  Problem Relation Age of Onset   Diabetes Mother    Heart attack Father    Coronary artery disease Father    Skin cancer Father    Diabetes Maternal Grandmother    Heart attack Paternal Grandfather    Social History   Socioeconomic History   Marital status: Married    Spouse name: Not on file   Number of children: Not on file   Years of education: Not on file   Highest education level: Not on file  Occupational History   Not on file  Tobacco Use   Smoking status: Never   Smokeless tobacco: Current    Types: Snuff  Vaping Use   Vaping status: Never Used  Substance and Sexual Activity   Alcohol use: Yes   Drug use: No   Sexual activity: Not on file  Other Topics Concern   Not on file  Social History Narrative   From [place].  Conservative. Not excited about living in [place], but moved to be near children and grandchildren whom he loves to be with. Wife is extrovert while he prefers to be home or around small group of people - misses a local bar.    Social Drivers of Corporate investment banker Strain: Low Risk  (09/13/2023)   Overall Financial Resource Strain (CARDIA)    Difficulty of Paying Living Expenses: Not hard at all  Food Insecurity: No Food Insecurity (09/13/2023)   Hunger Vital Sign    Worried About Running Out of Food in the Last Year: Never true    Ran Out of Food in the Last Year: Never true  Transportation Needs: No Transportation Needs (09/13/2023)   PRAPARE - Doctor, general practice (Medical): No    Lack of Transportation (Non-Medical): No  Physical Activity: Inactive (09/13/2023)   Exercise Vital Sign    Days of Exercise per Week: 0 days    Minutes of Exercise per Session: 0 min  Stress: No Stress Concern Present (09/13/2023)   [employer] of Occupational Health - Occupational Stress Questionnaire    Feeling of Stress: Not at all  Social Connections: Moderately Isolated (09/13/2023)   Social Connection and Isolation Panel    Frequency of Communication with Friends and Family: More than three times a week    Frequency of Social Gatherings with Friends and Family: More than three times a week    Attends Religious Services: Never    Database administrator or Organizations: No    Attends Engineer, structural: Never    Marital Status: Married    Tobacco Counseling Ready to quit: Not Answered Counseling given: Not Answered    Clinical Intake:  Pre-visit preparation completed: Yes  Pain : No/denies pain     BMI - recorded: 32.75 Nutritional Status: BMI > 30  Obese Nutritional Risks: None Diabetes: No  Lab Results  Component Value Date   HGBA1C 6.5 10/20/2022   HGBA1C 6.0 (A) 02/17/2022   HGBA1C 6.5 09/23/2021     How often do you need to have someone help you when you read instructions, pamphlets, or other written materials from your doctor or pharmacy?: 1 - Never  Interpreter Needed?: No  Information entered by :: Ellouise Haws, LPN   Activities of Daily Living     09/13/2023   11:34 AM  In your present state of health, do you have any difficulty performing the following activities:  Hearing? 1  Comment hearing aids  Vision? 0  Difficulty concentrating or making decisions? 0  Walking or climbing stairs? 0  Dressing or bathing? 0  Doing errands, shopping? 0  Preparing Food and eating ? N  Using the Toilet? N  In the past six months, have you accidently leaked urine? N  Do you have problems with loss of  bowel control? N  Managing your Medications? N  Managing your Finances? N  Housekeeping or managing your Housekeeping? N    Patient Care Team: Kennyth Worth HERO, MD as PCP - General (Family Medicine)  I have updated your Care Teams any recent Medical Services you may have received from other providers in the past year.     Assessment:   This is a routine wellness examination for Shaun Prince.  Hearing/Vision screen Hearing Screening - Comments:: Pt has hearing aids  Vision Screening - Comments:: Encouraged to follow up with eye provider    Goals Addressed  This Visit's Progress    Patient Stated       Be more active        Depression Screen     09/13/2023   11:36 AM 10/20/2022    1:29 PM 06/29/2022    3:23 PM 02/17/2022   11:35 AM 09/23/2021    1:00 PM 02/21/2019    1:49 PM 09/28/2016   11:15 AM  PHQ 2/9 Scores  PHQ - 2 Score 0 0 0 0 0 0 0    Fall Risk     09/13/2023   11:45 AM 10/20/2022    1:29 PM 06/29/2022    3:27 PM 02/17/2022   11:35 AM 09/23/2021    1:00 PM  Fall Risk   Falls in the past year? 0 0 0 0   Number falls in past yr: 0 0 0 0 0  Injury with Fall? 0 0 0 0 0  Risk for fall due to : Impaired balance/gait No Fall Risks Impaired vision;Impaired mobility No Fall Risks No Fall Risks  Risk for fall due to: Comment   bad ankle    Follow up Falls prevention discussed  Falls prevention discussed      MEDICARE RISK AT HOME:  Medicare Risk at Home Any stairs in or around the home?: Yes If so, are there any without handrails?: No Home free of loose throw rugs in walkways, pet beds, electrical cords, etc?: Yes Adequate lighting in your home to reduce risk of falls?: Yes Life alert?: No Use of a cane, walker or w/c?: No Grab bars in the bathroom?: Yes Shower chair or bench in shower?: No Elevated toilet seat or a handicapped toilet?: No  TIMED UP AND GO:  Was the test performed?  Yes  Length of time to ambulate 10 feet: 10 sec Gait steady and  fast without use of assistive device  Cognitive Function: 6CIT completed    09/16/2017    5:32 PM  MMSE - Mini Mental State Exam  Not completed: --        09/13/2023   11:46 AM 06/29/2022    3:29 PM  6CIT Screen  What Year? 0 points 0 points  What month? 0 points 0 points  What time? 0 points 0 points  Count back from 20 0 points 0 points  Months in reverse 0 points 0 points  Repeat phrase 0 points 0 points  Total Score 0 points 0 points    Immunizations Immunization History  Administered Date(s) Administered   Fluad Quad(high Dose 65+) 11/25/2018   Influenza-Unspecified 01/06/2017, 12/28/2017   PFIZER(Purple Top)SARS-COV-2 Vaccination 04/10/2019, 05/05/2019, 12/06/2019, 06/05/2020   Pfizer Covid-19 Vaccine Bivalent Booster 4yrs & up 01/06/2021   Pneumococcal Conjugate-13 01/13/2016, 12/28/2017   Pneumococcal Polysaccharide-23 10/23/2011, 09/21/2020   Respiratory Syncytial Virus Vaccine ,Recomb Aduvanted(Arexvy ) 12/02/2021   Tdap 01/14/2016, 12/22/2017    Screening Tests Health Maintenance  Topic Date Due   Zoster Vaccines- Shingrix (1 of 2) Never done   COVID-19 Vaccine (6 - 2024-25 season) 11/01/2022   INFLUENZA VACCINE  10/01/2023   Medicare Annual Wellness (AWV)  09/12/2024   DTaP/Tdap/Td (3 - Td or Tdap) 12/23/2027   Pneumococcal Vaccine: 50+ Years  Completed   Hepatitis C Screening  Completed   Hepatitis B Vaccines  Aged Out   HPV VACCINES  Aged Out   Meningococcal B Vaccine  Aged Out   Colonoscopy  Discontinued    Health Maintenance  Health Maintenance Due  Topic Date Due   Zoster Vaccines- Shingrix (1  of 2) Never done   COVID-19 Vaccine (6 - 2024-25 season) 11/01/2022   Health Maintenance Items Addressed: See Nurse Notes at the end of this note  Additional Screening:  Vision Screening: Recommended annual ophthalmology exams for early detection of glaucoma and other disorders of the eye. Would you like a referral to an eye doctor? No    Dental  Screening: Recommended annual dental exams for proper oral hygiene  Community Resource Referral / Chronic Care Management: CRR required this visit?  No   CCM required this visit?  No   Plan:    I have personally reviewed and noted the following in the patient's chart:   Medical and social history Use of alcohol, tobacco or illicit drugs  Current medications and supplements including opioid prescriptions. Patient is not currently taking opioid prescriptions. Functional ability and status Nutritional status Physical activity Advanced directives List of other physicians Hospitalizations, surgeries, and ER visits in previous 12 months Vitals Screenings to include cognitive, depression, and falls Referrals and appointments  In addition, I have reviewed and discussed with patient certain preventive protocols, quality metrics, and best practice recommendations. A written personalized care plan for preventive services as well as general preventive health recommendations were provided to patient.   Ellouise VEAR Haws, LPN   2/85/7974   After Visit Summary: (In Person-Printed) AVS printed and given to the patient  Notes: PCP Follow Up Recommendations: discussed the need to follow prescription of metformin  and simvastatin . Encouraged patient to also follow diet and exercise

## 2023-09-13 NOTE — Patient Instructions (Signed)
 Mr. Ridlon , Thank you for taking time out of your busy schedule to complete your Annual Wellness Visit with me. I enjoyed our conversation and look forward to speaking with you again next year. I, as well as your care team,  appreciate your ongoing commitment to your health goals. Please review the following plan we discussed and let me know if I can assist you in the future. Your Game plan/ To Do List    Referrals: If you haven't heard from the office you've been referred to, please reach out to them at the phone provided.   Follow up Visits: Next Medicare AWV with our clinical staff: 09/18/24   Have you seen your provider in the last 6 months (3 months if uncontrolled diabetes)? No Next Office Visit with your provider: 10/25/23  Clinician Recommendations:  Aim for 30 minutes of exercise or brisk walking, 6-8 glasses of water, and 5 servings of fruits and vegetables each day.       This is a list of the screening recommended for you and due dates:  Health Maintenance  Topic Date Due   Zoster (Shingles) Vaccine (1 of 2) Never done   COVID-19 Vaccine (6 - 2024-25 season) 11/01/2022   Medicare Annual Wellness Visit  06/29/2023   Flu Shot  10/01/2023   DTaP/Tdap/Td vaccine (3 - Td or Tdap) 12/23/2027   Pneumococcal Vaccine for age over 21  Completed   Hepatitis C Screening  Completed   Hepatitis B Vaccine  Aged Out   HPV Vaccine  Aged Out   Meningitis B Vaccine  Aged Out   Colon Cancer Screening  Discontinued    Advanced directives: (Copy Requested) Please bring a copy of your health care power of attorney and living will to the office to be added to your chart at your convenience. You can mail to [facility] [address] [place], [place] [address] or email to [email or web address] Advance Care Planning is important because it:  [x]  Makes sure you receive the medical care that is consistent with your values, goals, and preferences  [x]  It provides guidance to  your family and loved ones and reduces their decisional burden about whether or not they are making the right decisions based on your wishes.  Follow the link provided in your after visit summary or read over the paperwork we have mailed to you to help you started getting your Advance Directives in place. If you need assistance in completing these, please reach out to us  so that we can help you!  See attachments for Preventive Care and Fall Prevention Tips.

## 2023-10-25 ENCOUNTER — Encounter: Payer: Medicare Other | Admitting: Family Medicine

## 2023-11-10 ENCOUNTER — Telehealth: Payer: Self-pay | Admitting: *Deleted

## 2023-11-10 ENCOUNTER — Other Ambulatory Visit: Payer: Self-pay | Admitting: *Deleted

## 2023-11-10 ENCOUNTER — Other Ambulatory Visit (HOSPITAL_BASED_OUTPATIENT_CLINIC_OR_DEPARTMENT_OTHER): Payer: Self-pay

## 2023-11-10 MED ORDER — COVID-19 MRNA VACC (MODERNA) 50 MCG/0.5ML IM SUSP: 0.5000 mL | Freq: Once | INTRAMUSCULAR | 0 refills | Status: DC

## 2023-11-10 MED ORDER — COVID-19 MRNA VACC (MODERNA) 50 MCG/0.5ML IM SUSP
0.5000 mL | Freq: Once | INTRAMUSCULAR | 0 refills | Status: AC
  Filled 2023-11-10: qty 0.5, 1d supply, fill #0

## 2023-11-10 NOTE — Telephone Encounter (Signed)
 Copied from CRM #[id number]. Topic: Clinical - Request for Lab/Test Order >> Nov 10, 2023  1:16 PM [name] wrote: Reason for CRM: Patient's spouse is calling requesting an order for the covid vaccine to be sent to [facility]  Phone: [phone] Fax: [phone].   Rx send to [facility] pharmacy [facility]

## 2023-11-29 ENCOUNTER — Other Ambulatory Visit (HOSPITAL_BASED_OUTPATIENT_CLINIC_OR_DEPARTMENT_OTHER): Payer: Self-pay

## 2023-11-29 MED ORDER — COMIRNATY 30 MCG/0.3ML IM SUSY
0.3000 mL | PREFILLED_SYRINGE | Freq: Once | INTRAMUSCULAR | 0 refills | Status: AC
  Filled 2023-11-29: qty 0.3, 1d supply, fill #0

## 2023-11-29 MED ORDER — FLUZONE HIGH-DOSE 0.5 ML IM SUSY
0.5000 mL | PREFILLED_SYRINGE | Freq: Once | INTRAMUSCULAR | 0 refills | Status: AC
  Filled 2023-11-29: qty 0.5, 1d supply, fill #0

## 2024-03-04 ENCOUNTER — Other Ambulatory Visit (HOSPITAL_BASED_OUTPATIENT_CLINIC_OR_DEPARTMENT_OTHER): Payer: Self-pay

## 2024-03-06 ENCOUNTER — Other Ambulatory Visit (HOSPITAL_BASED_OUTPATIENT_CLINIC_OR_DEPARTMENT_OTHER): Payer: Self-pay

## 2024-03-08 ENCOUNTER — Other Ambulatory Visit (HOSPITAL_BASED_OUTPATIENT_CLINIC_OR_DEPARTMENT_OTHER): Payer: Self-pay

## 2024-03-08 MED ORDER — PREVNAR 20 0.5 ML IM SUSY
0.5000 mL | PREFILLED_SYRINGE | Freq: Once | INTRAMUSCULAR | 0 refills | Status: AC
  Filled 2024-03-08: qty 0.5, 1d supply, fill #0

## 2024-09-19 ENCOUNTER — Ambulatory Visit
